# Patient Record
Sex: Female | Born: 1970
Health system: Southern US, Community
[De-identification: ages and names within clinical notes are randomized; demographics above are authoritative.]

---

## 2002-03-23 ENCOUNTER — Encounter: Payer: Self-pay | Admitting: Emergency Medicine

## 2002-03-23 ENCOUNTER — Emergency Department (HOSPITAL_COMMUNITY): Admission: EM | Admit: 2002-03-23 | Discharge: 2002-03-24 | Payer: Self-pay | Admitting: Emergency Medicine

## 2002-03-29 ENCOUNTER — Emergency Department (HOSPITAL_COMMUNITY): Admission: EM | Admit: 2002-03-29 | Discharge: 2002-03-29 | Payer: Self-pay | Admitting: Emergency Medicine

## 2002-07-08 ENCOUNTER — Encounter: Payer: Self-pay | Admitting: Internal Medicine

## 2002-07-08 ENCOUNTER — Encounter: Admission: RE | Admit: 2002-07-08 | Discharge: 2002-07-08 | Payer: Self-pay | Admitting: Internal Medicine

## 2003-09-07 ENCOUNTER — Encounter (HOSPITAL_COMMUNITY): Admission: RE | Admit: 2003-09-07 | Discharge: 2003-12-06 | Payer: Self-pay | Admitting: Internal Medicine

## 2003-11-07 ENCOUNTER — Emergency Department (HOSPITAL_COMMUNITY): Admission: EM | Admit: 2003-11-07 | Discharge: 2003-11-07 | Payer: Self-pay | Admitting: Emergency Medicine

## 2006-01-24 ENCOUNTER — Other Ambulatory Visit: Admission: RE | Admit: 2006-01-24 | Discharge: 2006-01-24 | Payer: Self-pay | Admitting: Gynecology

## 2007-02-26 ENCOUNTER — Inpatient Hospital Stay (HOSPITAL_COMMUNITY): Admission: AD | Admit: 2007-02-26 | Discharge: 2007-02-26 | Payer: Self-pay | Admitting: Gynecology

## 2007-05-22 ENCOUNTER — Ambulatory Visit (HOSPITAL_COMMUNITY): Admission: RE | Admit: 2007-05-22 | Discharge: 2007-05-22 | Payer: Self-pay | Admitting: Obstetrics

## 2007-08-12 ENCOUNTER — Ambulatory Visit (HOSPITAL_COMMUNITY): Admission: RE | Admit: 2007-08-12 | Discharge: 2007-08-12 | Payer: Self-pay | Admitting: Obstetrics

## 2007-10-05 ENCOUNTER — Inpatient Hospital Stay (HOSPITAL_COMMUNITY): Admission: AD | Admit: 2007-10-05 | Discharge: 2007-10-06 | Payer: Self-pay | Admitting: Obstetrics

## 2007-10-17 ENCOUNTER — Inpatient Hospital Stay (HOSPITAL_COMMUNITY): Admission: AD | Admit: 2007-10-17 | Discharge: 2007-10-23 | Payer: Self-pay | Admitting: Obstetrics

## 2007-10-20 ENCOUNTER — Encounter: Payer: Self-pay | Admitting: Obstetrics

## 2011-03-20 NOTE — Op Note (Signed)
Danielle Santiago, Danielle Santiago           ACCOUNT NO.:  [id number]   MEDICAL RECORD NO.:  [id number]          PATIENT TYPE:  INP   LOCATION:  9137                          FACILITY:  [facility]   PHYSICIAN:  Charles A. Clearance Coots, M.D.DATE OF BIRTH:  1971/06/22   DATE OF PROCEDURE:  10/20/2007  DATE OF DISCHARGE:                               OPERATIVE REPORT   PREOPERATIVE DIAGNOSIS:  39 weeks' gestation, gestational diabetes  mellitus, large for gestational age infant, failed induction of labor.   DISCHARGE DIAGNOSIS:  39 weeks' gestation, gestational diabetes  mellitus, large for gestational age infant, failed induction of labor.   PROCEDURE:  Primary low transverse cesarean section.   ANESTHESIA:  Spinal.   ESTIMATED BLOOD LOSS:  700 mL.   IV FLUIDS:  2700 mL.   URINE OUTPUT:  150 mL clear.   COMPLICATIONS:  None.   Foley to gravity.   FINDINGS:  Viable female at 98.  Apgars of 8 at one minute and 9 at  five minutes, weight of 9 pounds 11 ounces, normal uterus, ovaries and  fallopian tubes.   SPECIMEN:  Placenta.   DISPOSITION OF SPECIMEN:  Pathology.   OPERATION:  The patient was brought to operating room after satisfactory  spinal anesthesia, the abdomen was prepped and draped in usual sterile  fashion.  Pfannenstiel skin incision was made with a scalpel.  That was  deepened down to the fascia with a scalpel.  Fascia was nicked in the  midline and the fascial incision was extended to the left and to the  right with curved Mayo scissors.  The superior and inferior fascial  edges were taken off the rectus muscles both blunt sharp dissection.  Rectus muscles bluntly and sharply divided in midline.  Peritoneum was  entered digitally and was digitally extended to the left and to right.  The bladder blade was positioned.  The vesicouterine fold peritoneum  above the reflection of the urinary bladder was grasped with forceps and  was incised and undermined with Metzenbaum scissors.   The incision was  extended to left and to right with Metzenbaum scissors.  Bladder flap  was bluntly developed and the bladder blade was repositioned in front of  the urinary bladder placing it well out of operative field.  The uterus  was then entered transversely in the lower uterine segment with the  scalpel.  Clear amniotic fluid was expelled.  The uterine incision was  extended to the left and to the right with the bandage scissors.  The  vertex was noted to be left occiput transverse.  The occiput was rotated  into the incision and the vertex was delivered with the aid of fundal  pressure from the assistant.  The infant's mouth and nose were suctioned  with a suction bulb and delivery was completed with the aid of fundal  pressure from the assistant.  The umbilical cord was doubly clamped and  cut and the infant was handed off to nursery staff.  Cord blood was  obtained and the placenta was manually removed from the uterus intact.  The endometrial surface was thoroughly debrided with a dry  lap sponge  and the edges of the uterine incision were grasped with ring forceps and  the uterus was closed after suture ligation of each corner of the  incision with interrupted suture of 0 Monocryl.  The uterus was then  closed with a continuous interlocking suture of 0 Monocryl.  Hemostasis  was excellent.  The bladder flap was then closed with continuous suture  of 3-0 Monocryl.  Pelvic cavity was then thoroughly irrigated with warm  saline solution and all clots were removed and the abdomen was then  closed as follows.  Peritoneum was closed with continuous suture of 2-0  Vicryl, the fascia was closed with continuous suture of 0 Vicryl.  Subcutaneous tissue was thoroughly irrigated with warm saline solution  and all areas of subcutaneous bleeding were coagulated with Bovie.  Skin  was then closed with a continuous subcuticular suture of 3-0 Monocryl.  Sterile bandage was applied to the  incision closure.  Surgical  technician indicated that all needle, sponge and instrument counts were  correct x2.  The patient tolerated the procedure well and was  transported to recovery room in satisfactory condition.      Charles A. Clearance Coots, M.D.  Electronically Signed     CAH/MEDQ  D:  10/20/2007  T:  10/20/2007  Job:  [id number]

## 2011-03-23 NOTE — Discharge Summary (Signed)
NAMEJERNEY, Danielle Santiago           ACCOUNT NO.:  [id number]   MEDICAL RECORD NO.:  [id number]          PATIENT TYPE:  INP   LOCATION:  9137                          FACILITY:  [facility]   PHYSICIAN:  Roseanna Rainbow, M.D.DATE OF BIRTH:  [birth year]   DATE OF ADMISSION:  10/17/2007  DATE OF DISCHARGE:  10/23/2007                               DISCHARGE SUMMARY   CHIEF COMPLAINT:  The patient is a 40 year old para 1 with an estimated  date of confinement of October 22, 2007, who presents for induction of  labor secondary to gestational diabetes.   HISTORY OF PRESENT ILLNESS:  Please see the above.   PAST SURGICAL HISTORY:  Diagnostic laparoscopy.   PAST MEDICAL HISTORY:  She denies.   MEDICATIONS:  Prenatal vitamins.   ALLERGIES:  No known drug allergies.   SOCIAL HISTORY:  She is married.  She denies any tobacco, ethanol or  drug use.   PHYSICAL EXAMINATION:  VITAL SIGNS:  Stable, afebrile.  LUNGS:  Clear to auscultation bilaterally.  HEART:  Regular rate and rhythm.  ABDOMEN:  Gravid, nontender.  PELVIC:  Cervix 1-2 cm dilated, long, vertex at -3 station.   ASSESSMENT AND PLAN:  1. Intrauterine pregnancy at 39+ weeks.  2. Gestational diabetes.  3. For induction of labor.   HOSPITAL COURSE:  The patient was admitted.  She was started on low-dose  Pitocin, however, there was no cervical change.  The Pitocin was  discontinued and the cervix was then ripened with Cervidil.  The  Cervidil was then removed.  An ultrasound was obtained for an estimated  fetal weight and the estimated fetal weight was 10 pounds 4 ounces.  She  was started on low-dose Pitocin,  however, there was no cervical change  and the decision was made to proceed with a cesarean delivery for failed  induction of labor.  Please see the dictated operative summary.  On  postoperative day #1, her hemoglobin was 8.7.  The remainder of her  hospital course was uneventful.  She was discharged to home on  postoperative day #3.   DISCHARGE DIAGNOSES:  1. Gestational diabetes.  2. Large for gestational age fetus, infant.  3. Failed induction of labor.   PROCEDURE:  Cesarean delivery.   CONDITION:  Good.   DIET:  Regular.   ACTIVITY:  Progressive activity, pelvic rest.   DISPOSITION:  The patient was to follow up in the office in 2 weeks.   MEDICATIONS:  Percocet and ibuprofen.      Roseanna Rainbow, M.D.  Electronically Signed     LAJ/MEDQ  D:  11/09/2007  T:  11/09/2007  Job:  [id number]

## 2011-08-10 LAB — CBC
HCT: 24.7 — ABNORMAL LOW
Hemoglobin: 8.7 — ABNORMAL LOW
MCHC: 35.3
MCV: 75.6 — ABNORMAL LOW
Platelets: 240
RBC: 3.27 — ABNORMAL LOW
RDW: 20.8 — ABNORMAL HIGH
WBC: 12.4 — ABNORMAL HIGH

## 2011-08-13 LAB — CBC
HCT: 31.4 — ABNORMAL LOW
Hemoglobin: 11.3 — ABNORMAL LOW
MCHC: 36
MCV: 74.8 — ABNORMAL LOW
Platelets: 276
RBC: 4.2
RDW: 19.5 — ABNORMAL HIGH
WBC: 13.4 — ABNORMAL HIGH

## 2011-08-13 LAB — RPR: RPR Ser Ql: NONREACTIVE

## 2014-03-03 ENCOUNTER — Other Ambulatory Visit: Payer: Self-pay

## 2014-03-03 DIAGNOSIS — Z1231 Encounter for screening mammogram for malignant neoplasm of breast: Secondary | ICD-10-CM

## 2014-03-23 ENCOUNTER — Ambulatory Visit
Admission: RE | Admit: 2014-03-23 | Discharge: 2014-03-23 | Disposition: A | Discharge status: Home / Self Care | Payer: BC Managed Care – PPO | Source: Ambulatory Visit

## 2014-03-23 ENCOUNTER — Encounter (INDEPENDENT_AMBULATORY_CARE_PROVIDER_SITE_OTHER): Payer: Self-pay

## 2014-03-23 DIAGNOSIS — Z1231 Encounter for screening mammogram for malignant neoplasm of breast: Secondary | ICD-10-CM

## 2015-03-15 ENCOUNTER — Other Ambulatory Visit: Payer: Self-pay

## 2015-03-15 DIAGNOSIS — Z1231 Encounter for screening mammogram for malignant neoplasm of breast: Secondary | ICD-10-CM

## 2015-03-25 ENCOUNTER — Ambulatory Visit
Admission: RE | Admit: 2015-03-25 | Discharge: 2015-03-25 | Disposition: A | Discharge status: Home / Self Care | Payer: BLUE CROSS/BLUE SHIELD | Source: Ambulatory Visit

## 2015-03-25 ENCOUNTER — Encounter (INDEPENDENT_AMBULATORY_CARE_PROVIDER_SITE_OTHER): Payer: Self-pay

## 2015-03-25 DIAGNOSIS — Z1231 Encounter for screening mammogram for malignant neoplasm of breast: Secondary | ICD-10-CM

## 2016-03-02 DIAGNOSIS — R509 Fever, unspecified: Secondary | ICD-10-CM | POA: Diagnosis not present

## 2016-03-03 DIAGNOSIS — K529 Noninfective gastroenteritis and colitis, unspecified: Secondary | ICD-10-CM | POA: Diagnosis not present

## 2016-03-03 DIAGNOSIS — R918 Other nonspecific abnormal finding of lung field: Secondary | ICD-10-CM | POA: Diagnosis not present

## 2016-04-17 ENCOUNTER — Other Ambulatory Visit: Payer: Self-pay

## 2016-04-17 ENCOUNTER — Other Ambulatory Visit: Payer: Self-pay | Admitting: Internal Medicine

## 2016-04-17 DIAGNOSIS — Z1231 Encounter for screening mammogram for malignant neoplasm of breast: Secondary | ICD-10-CM

## 2016-04-30 ENCOUNTER — Ambulatory Visit
Admission: RE | Admit: 2016-04-30 | Discharge: 2016-04-30 | Disposition: A | Discharge status: Home / Self Care | Payer: BLUE CROSS/BLUE SHIELD | Source: Ambulatory Visit | Attending: Internal Medicine | Admitting: Internal Medicine

## 2016-04-30 DIAGNOSIS — Z1231 Encounter for screening mammogram for malignant neoplasm of breast: Secondary | ICD-10-CM

## 2017-03-05 DIAGNOSIS — M10079 Idiopathic gout, unspecified ankle and foot: Secondary | ICD-10-CM | POA: Diagnosis not present

## 2017-03-05 DIAGNOSIS — M25571 Pain in right ankle and joints of right foot: Secondary | ICD-10-CM | POA: Diagnosis not present

## 2017-03-05 DIAGNOSIS — M792 Neuralgia and neuritis, unspecified: Secondary | ICD-10-CM | POA: Diagnosis not present

## 2017-03-06 DIAGNOSIS — M25579 Pain in unspecified ankle and joints of unspecified foot: Secondary | ICD-10-CM | POA: Diagnosis not present

## 2017-03-06 DIAGNOSIS — Z131 Encounter for screening for diabetes mellitus: Secondary | ICD-10-CM | POA: Diagnosis not present

## 2017-03-06 DIAGNOSIS — Z13220 Encounter for screening for lipoid disorders: Secondary | ICD-10-CM | POA: Diagnosis not present

## 2017-03-06 DIAGNOSIS — E0590 Thyrotoxicosis, unspecified without thyrotoxic crisis or storm: Secondary | ICD-10-CM | POA: Diagnosis not present

## 2017-03-06 DIAGNOSIS — J302 Other seasonal allergic rhinitis: Secondary | ICD-10-CM | POA: Diagnosis not present

## 2017-05-15 ENCOUNTER — Other Ambulatory Visit: Payer: Self-pay | Admitting: Internal Medicine

## 2017-05-15 DIAGNOSIS — Z1231 Encounter for screening mammogram for malignant neoplasm of breast: Secondary | ICD-10-CM

## 2017-06-05 ENCOUNTER — Encounter: Payer: Self-pay | Admitting: Obstetrics

## 2017-06-05 ENCOUNTER — Ambulatory Visit (INDEPENDENT_AMBULATORY_CARE_PROVIDER_SITE_OTHER): Payer: BLUE CROSS/BLUE SHIELD | Admitting: Obstetrics

## 2017-06-05 ENCOUNTER — Ambulatory Visit
Admission: RE | Admit: 2017-06-05 | Discharge: 2017-06-05 | Disposition: A | Discharge status: Home / Self Care | Payer: BLUE CROSS/BLUE SHIELD | Source: Ambulatory Visit | Attending: Internal Medicine | Admitting: Internal Medicine

## 2017-06-05 VITALS — BP 125/85 | HR 86 | Wt 224.6 lb

## 2017-06-05 DIAGNOSIS — Z124 Encounter for screening for malignant neoplasm of cervix: Secondary | ICD-10-CM

## 2017-06-05 DIAGNOSIS — Z1231 Encounter for screening mammogram for malignant neoplasm of breast: Secondary | ICD-10-CM | POA: Diagnosis not present

## 2017-06-05 DIAGNOSIS — Z01419 Encounter for gynecological examination (general) (routine) without abnormal findings: Secondary | ICD-10-CM | POA: Diagnosis not present

## 2017-06-05 DIAGNOSIS — Z3009 Encounter for other general counseling and advice on contraception: Secondary | ICD-10-CM

## 2017-06-05 NOTE — Progress Notes (Signed)
Subjective:        Danielle Santiago is a 46 y.o. female here for a routine exam.  Current complaints: None.    Personal health questionnaire:  Is patient Ashkenazi Jewish, have a family history of breast and/or ovarian cancer: no Is there a family history of uterine cancer diagnosed at age < 1050, gastrointestinal cancer, urinary tract cancer, family member who is a Personnel officerLynch syndrome-associated carrier: no Is the patient overweight and hypertensive, family history of diabetes, personal history of gestational diabetes, preeclampsia or PCOS: no Is patient over 7055, have PCOS,  family history of premature CHD under age 44, diabetes, smoke, have hypertension or peripheral artery disease:  no At any time, has a partner hit, kicked or otherwise hurt or frightened you?: no Over the past 2 weeks, have you felt down, depressed or hopeless?: no Over the past 2 weeks, have you felt little interest or pleasure in doing things?:no   Gynecologic History Patient's last menstrual period was 05/22/2017 (exact date). Contraception: none Last Pap: unknown. Results were: normal Last mammogram: 2018. Results were: normal  Obstetric History OB History  Gravida Para Term Preterm AB Living  3         1  SAB TAB Ectopic Multiple Live Births               # Outcome Date GA Lbr Len/2nd Weight Sex Delivery Anes PTL Lv  3 Gravida           2 Gravida           1 Gravida               History reviewed. No pertinent past medical history.  History reviewed. No pertinent surgical history.   Current Outpatient Prescriptions:  .  cetirizine (ZYRTEC) 10 MG tablet, Take 10 mg by mouth daily., Disp: , Rfl:  .  montelukast (SINGULAIR) 10 MG tablet, Take 10 mg by mouth at bedtime., Disp: , Rfl:  No Known Allergies  Social History  Substance Use Topics  . Smoking status: Never Smoker  . Smokeless tobacco: Never Used  . Alcohol use No    Family History  Problem Relation Age of Onset  . Diabetes Father        Review of Systems  Constitutional: negative for fatigue and weight loss Respiratory: negative for cough and wheezing Cardiovascular: negative for chest pain, fatigue and palpitations Gastrointestinal: negative for abdominal pain and change in bowel habits Musculoskeletal:negative for myalgias Neurological: negative for gait problems and tremors Behavioral/Psych: negative for abusive relationship, depression Endocrine: negative for temperature intolerance    Genitourinary:negative for abnormal menstrual periods, genital lesions, hot flashes, sexual problems and vaginal discharge Integument/breast: negative for breast lump, breast tenderness, nipple discharge and skin lesion(s)    Objective:       BP 125/85   Pulse 86   Wt 224 lb 9.6 oz (101.9 kg)   LMP 05/22/2017 (Exact Date)  General:   alert  Skin:   no rash or abnormalities  Lungs:   clear to auscultation bilaterally  Heart:   regular rate and rhythm, S1, S2 normal, no murmur, click, rub or gallop  Breasts:   normal without suspicious masses, skin or nipple changes or axillary nodes  Abdomen:  normal findings: no organomegaly, soft, non-tender and no hernia  Pelvis:  External genitalia: normal general appearance Urinary system: urethral meatus normal and bladder without fullness, nontender Vaginal: normal without tenderness, induration or masses Cervix: normal appearance Adnexa: normal bimanual exam  Uterus: anteverted and non-tender, normal size   Lab Review Urine pregnancy test Labs reviewed yes Radiologic studies reviewed yes  50% of 20 min visit spent on counseling and coordination of care.    Assessment:     1. Encounter for routine gynecological examination with Papanicolaou smear of cervix Rx: - Cytology - PAP - Cervicovaginal ancillary only  2. Encounter for other general counseling and advice on contraception - declines contraception   Plan:    Education reviewed: calcium supplements, depression  evaluation, low fat, low cholesterol diet, self breast exams and weight bearing exercise. Contraception: none. Follow up in: 1 year.   Meds ordered this encounter  Medications  . cetirizine (ZYRTEC) 10 MG tablet    Sig: Take 10 mg by mouth daily.  . montelukast (SINGULAIR) 10 MG tablet    Sig: Take 10 mg by mouth at bedtime.   No orders of the defined types were placed in this encounter.    Patient ID: Danielle Santiago, female   DOB: 08/25/1971, 46 y.o.   MRN: [id number]

## 2017-06-05 NOTE — Progress Notes (Signed)
Patient is in the office for annual exam- patient reports she is doing well today.

## 2017-06-07 LAB — CERVICOVAGINAL ANCILLARY ONLY
Bacterial vaginitis: NEGATIVE
Candida vaginitis: NEGATIVE

## 2017-06-07 LAB — CYTOLOGY - PAP
Diagnosis: NEGATIVE
HPV: NOT DETECTED

## 2018-03-21 DIAGNOSIS — Z13220 Encounter for screening for lipoid disorders: Secondary | ICD-10-CM | POA: Diagnosis not present

## 2018-03-21 DIAGNOSIS — Z0000 Encounter for general adult medical examination without abnormal findings: Secondary | ICD-10-CM | POA: Diagnosis not present

## 2018-03-21 DIAGNOSIS — Z131 Encounter for screening for diabetes mellitus: Secondary | ICD-10-CM | POA: Diagnosis not present

## 2018-10-16 ENCOUNTER — Other Ambulatory Visit: Payer: Self-pay | Admitting: Internal Medicine

## 2018-10-16 DIAGNOSIS — Z1231 Encounter for screening mammogram for malignant neoplasm of breast: Secondary | ICD-10-CM

## 2018-10-27 ENCOUNTER — Other Ambulatory Visit: Payer: Self-pay

## 2018-10-27 ENCOUNTER — Encounter (HOSPITAL_COMMUNITY): Payer: Self-pay | Admitting: *Deleted

## 2018-10-27 ENCOUNTER — Emergency Department (HOSPITAL_COMMUNITY)
Admission: EM | Admit: 2018-10-27 | Discharge: 2018-10-27 | Disposition: A | Discharge status: Home / Self Care | Payer: BLUE CROSS/BLUE SHIELD | Attending: Emergency Medicine | Admitting: Emergency Medicine

## 2018-10-27 ENCOUNTER — Emergency Department (HOSPITAL_COMMUNITY): Payer: BLUE CROSS/BLUE SHIELD

## 2018-10-27 DIAGNOSIS — M549 Dorsalgia, unspecified: Secondary | ICD-10-CM | POA: Diagnosis not present

## 2018-10-27 DIAGNOSIS — R079 Chest pain, unspecified: Secondary | ICD-10-CM | POA: Diagnosis not present

## 2018-10-27 DIAGNOSIS — M62830 Muscle spasm of back: Secondary | ICD-10-CM | POA: Insufficient documentation

## 2018-10-27 DIAGNOSIS — R000 Tachycardia, unspecified: Secondary | ICD-10-CM | POA: Diagnosis not present

## 2018-10-27 DIAGNOSIS — R0789 Other chest pain: Secondary | ICD-10-CM | POA: Diagnosis not present

## 2018-10-27 LAB — I-STAT BETA HCG BLOOD, ED (MC, WL, AP ONLY): I-stat hCG, quantitative: <5 m[IU]/mL (ref <5)

## 2018-10-27 LAB — BASIC METABOLIC PANEL
Anion gap: 12 (ref 5–15)
BUN: 10 mg/dL (ref 6–20)
CO2: 21 mmol/L — AB (ref 22–32)
Calcium: 9.4 mg/dL (ref 8.9–10.3)
Chloride: 105 mmol/L (ref 98–111)
Creatinine, Ser: 0.82 mg/dL (ref 0.44–1.00)
GFR calc Af Amer: >60 mL/min (ref >60)
GFR calc non Af Amer: >60 mL/min (ref >60)
GLUCOSE: 98 mg/dL (ref 70–99)
Potassium: 4.2 mmol/L (ref 3.5–5.1)
Sodium: 138 mmol/L (ref 135–145)

## 2018-10-27 LAB — CBC
HCT: 31.7 % — ABNORMAL LOW (ref 36.0–46.0)
Hemoglobin: 10.9 g/dL — ABNORMAL LOW (ref 12.0–15.0)
MCH: 22.1 pg — ABNORMAL LOW (ref 26.0–34.0)
MCHC: 34.4 g/dL (ref 30.0–36.0)
MCV: 64.3 fL — ABNORMAL LOW (ref 80.0–100.0)
Platelets: 253 10*3/uL (ref 150–400)
RBC: 4.93 MIL/uL (ref 3.87–5.11)
RDW: 19 % — ABNORMAL HIGH (ref 11.5–15.5)
WBC: 10.1 10*3/uL (ref 4.0–10.5)
nRBC: 0 % (ref 0.0–0.2)

## 2018-10-27 LAB — TROPONIN I
Troponin I: <0.03 ng/mL (ref <0.03)
Troponin I: <0.03 ng/mL (ref <0.03)

## 2018-10-27 LAB — LIPASE, BLOOD: Lipase: 28 U/L (ref 11–51)

## 2018-10-27 LAB — D-DIMER, QUANTITATIVE: D-Dimer, Quant: 0.3 ug/mL-FEU (ref 0.00–0.50)

## 2018-10-27 MED ORDER — DIAZEPAM 5 MG PO TABS: 5.0000 mg | ORAL_TABLET | Freq: Two times a day (BID) | ORAL | 0 refills | Status: DC

## 2018-10-27 MED ORDER — KETOROLAC TROMETHAMINE 10 MG PO TABS: 10.0000 mg | ORAL_TABLET | Freq: Four times a day (QID) | ORAL | 0 refills | Status: DC | PRN

## 2018-10-27 MED ORDER — KETOROLAC TROMETHAMINE 30 MG/ML IJ SOLN
30.0000 mg | Freq: Once | INTRAMUSCULAR | Status: AC
  Administered 2018-10-27: 30 mg via INTRAVENOUS
  Filled 2018-10-27: qty 1

## 2018-10-27 MED ORDER — HYDROCODONE-ACETAMINOPHEN 5-325 MG PO TABS: 1.0000 | ORAL_TABLET | ORAL | 0 refills | Status: DC | PRN

## 2018-10-27 NOTE — ED Notes (Signed)
Pt stable and ambulatory for discharge, states understanding follow up.  

## 2018-10-27 NOTE — ED Provider Notes (Signed)
[facility] EMERGENCY DEPARTMENT Provider Note   CSN: [id number] Arrival date & time: 10/27/18  1636     History   Chief Complaint Chief Complaint  Patient presents with  . Chest Pain    HPI Danielle Santiago is a 47 y.o. female.  Pt presents to the ED today with right sided upper back pain that radiates to her chest.  She said it started about 1 week ago.  She said it hurts to move.  She has been taking otc tylenol/ibuprofen and using a massaging machine, but it continues to hurt.  She denies any abd pain.  No n/v/d.  No sob.  No leg swelling.  No long trips.     History reviewed. No pertinent past medical history.  There are no active problems to display for this patient.   History reviewed. No pertinent surgical history.   OB History    Gravida  3   Para      Term      Preterm      AB      Living  1     SAB      TAB      Ectopic      Multiple      Live Births               Home Medications    Prior to Admission medications   Medication Sig Start Date End Date Taking? Authorizing Provider  cetirizine (ZYRTEC) 10 MG tablet Take 10 mg by mouth daily.   Yes [provider]  montelukast (SINGULAIR) 10 MG tablet Take 10 mg by mouth at bedtime.   Yes [provider]  diazepam (VALIUM) 5 MG tablet Take 1 tablet (5 mg total) by mouth 2 (two) times daily. 10/27/18   Jacalyn Lefevre, MD  HYDROcodone-acetaminophen (NORCO/VICODIN) 5-325 MG tablet Take 1 tablet by mouth every 4 (four) hours as needed. 10/27/18   Jacalyn Lefevre, MD  ketorolac (TORADOL) 10 MG tablet Take 1 tablet (10 mg total) by mouth every 6 (six) hours as needed. 10/27/18   Jacalyn Lefevre, MD    Family History Family History  Problem Relation Age of Onset  . Diabetes Father     Social History Social History   Tobacco Use  . Smoking status: Never Smoker  . Smokeless tobacco: Never Used  Substance Use Topics  . Alcohol use: No  . Drug  use: No     Allergies   Patient has no known allergies.   Review of Systems Review of Systems  Cardiovascular: Positive for chest pain.  Musculoskeletal: Positive for back pain.  All other systems reviewed and are negative.    Physical Exam Updated Vital Signs BP 140/72   Pulse 78   Temp 98.9 F (37.2 C) (Oral)   Resp 20   Ht 5\' 2"  (1.575 m)   Wt 89.8 kg   LMP 10/09/2018   SpO2 99%   BMI 36.21 kg/m   Physical Exam Vitals signs and nursing note reviewed.  Constitutional:      Appearance: She is well-developed. She is obese.  HENT:     Head: Normocephalic and atraumatic.  Eyes:     Extraocular Movements: Extraocular movements intact.     Pupils: Pupils are equal, round, and reactive to light.  Neck:     Musculoskeletal: Normal range of motion and neck supple.  Cardiovascular:     Rate and Rhythm: Normal rate and regular rhythm.  Heart sounds: Normal heart sounds.  Pulmonary:     Effort: Pulmonary effort is normal.     Breath sounds: Normal breath sounds.    Abdominal:     General: Bowel sounds are normal.     Palpations: Abdomen is soft.  Musculoskeletal: Normal range of motion.  Skin:    General: Skin is warm and dry.     Capillary Refill: Capillary refill takes less than 2 seconds.  Neurological:     General: No focal deficit present.     Mental Status: She is alert and oriented to person, place, and time.  Psychiatric:        Mood and Affect: Mood normal.        Behavior: Behavior normal.      ED Treatments / Results  Labs (all labs ordered are listed, but only abnormal results are displayed) Labs Reviewed  BASIC METABOLIC PANEL - Abnormal; Notable for the following components:      Result Value   CO2 21 (*)    All other components within normal limits  CBC - Abnormal; Notable for the following components:   Hemoglobin 10.9 (*)    HCT 31.7 (*)    MCV 64.3 (*)    MCH 22.1 (*)    RDW 19.0 (*)    All other components within normal  limits  TROPONIN I  D-DIMER, QUANTITATIVE (NOT [facility])  LIPASE, BLOOD  TROPONIN I  I-STAT BETA HCG BLOOD, ED ([facility], [facility], [facility] ONLY)    EKG EKG Interpretation  Date/Time:  Monday October 27 2018 17:27:38 EST Ventricular Rate:  104 PR Interval:  158 QRS Duration: 82 QT Interval:  346 QTC Calculation: 454 R Axis:   39 Text Interpretation:  Sinus tachycardia Otherwise normal ECG No old tracing to compare Confirmed by Jacalyn LefevreHaviland, Tionne Dayhoff [id number]) on 10/27/2018 8:54:47 PM   Radiology Dg Chest 2 View  Result Date: 10/27/2018 CLINICAL DATA:  Right-sided back pain radiating to the front since this morning. No reported acute injury. EXAM: CHEST - 2 VIEW COMPARISON:  None. FINDINGS: The heart size is at the upper limits of normal. The mediastinal contours are normal. The lungs are clear. There is no pleural effusion or pneumothorax. Mild degenerative changes are present within the spine. No acute osseous findings are seen. IMPRESSION: Borderline heart size.  No acute cardiopulmonary process. Electronically Signed   By: Carey BullocksWilliam  Veazey M.D.   On: 10/27/2018 18:01    Procedures Procedures (including critical care time)  Medications Ordered in ED Medications  ketorolac (TORADOL) 30 MG/ML injection 30 mg (30 mg Intravenous Given 10/27/18 2119)     Initial Impression / Assessment and Plan / ED Course  I have reviewed the triage vital signs and the nursing notes.  Pertinent labs & imaging results that were available during my care of the patient were reviewed by me and considered in my medical decision making (see chart for details).    Pt is feeling much better.  DDimer is negative and labs nl.  Pt is stable for d/c.  Return if worse.  Final Clinical Impressions(s) / ED Diagnoses   Final diagnoses:  Spasm of thoracic back muscle    ED Discharge Orders         Ordered    HYDROcodone-acetaminophen (NORCO/VICODIN) 5-325 MG tablet  Every 4 hours PRN     10/27/18 2258    ketorolac (TORADOL)  10 MG tablet  Every 6 hours PRN     10/27/18 2258    diazepam (  VALIUM) 5 MG tablet  2 times daily     10/27/18 2258           Jacalyn LefevreHaviland, Nimsi Males, MD 10/27/18 [phone]

## 2018-10-27 NOTE — ED Triage Notes (Signed)
The pt is c/o chest anterior and posterior with rt shoulder radiation with movement for over one week no n no vomiting no diarrhea   lmp 5th

## 2018-11-26 ENCOUNTER — Ambulatory Visit
Admission: RE | Admit: 2018-11-26 | Discharge: 2018-11-26 | Disposition: A | Discharge status: Home / Self Care | Payer: BLUE CROSS/BLUE SHIELD | Source: Ambulatory Visit | Attending: Internal Medicine | Admitting: Internal Medicine

## 2018-11-26 DIAGNOSIS — Z1231 Encounter for screening mammogram for malignant neoplasm of breast: Secondary | ICD-10-CM

## 2019-04-01 ENCOUNTER — Ambulatory Visit (HOSPITAL_COMMUNITY)
Admission: EM | Admit: 2019-04-01 | Discharge: 2019-04-01 | Disposition: A | Discharge status: Home / Self Care | Payer: BLUE CROSS/BLUE SHIELD | Attending: Internal Medicine | Admitting: Internal Medicine

## 2019-04-01 ENCOUNTER — Encounter (HOSPITAL_COMMUNITY): Payer: Self-pay

## 2019-04-01 ENCOUNTER — Other Ambulatory Visit: Payer: Self-pay

## 2019-04-01 DIAGNOSIS — S46911A Strain of unspecified muscle, fascia and tendon at shoulder and upper arm level, right arm, initial encounter: Secondary | ICD-10-CM

## 2019-04-01 DIAGNOSIS — X500XXA Overexertion from strenuous movement or load, initial encounter: Secondary | ICD-10-CM | POA: Diagnosis not present

## 2019-04-01 DIAGNOSIS — M25511 Pain in right shoulder: Secondary | ICD-10-CM | POA: Diagnosis not present

## 2019-04-01 MED ORDER — MELOXICAM 7.5 MG PO TABS: 7.5000 mg | ORAL_TABLET | Freq: Two times a day (BID) | ORAL | 0 refills | Status: AC | PRN

## 2019-04-01 MED ORDER — CYCLOBENZAPRINE HCL 10 MG PO TABS: 10.0000 mg | ORAL_TABLET | Freq: Every evening | ORAL | 0 refills | Status: AC | PRN

## 2019-04-01 MED ORDER — KETOROLAC TROMETHAMINE 60 MG/2ML IM SOLN
INTRAMUSCULAR | Status: AC
  Filled 2019-04-01: qty 2

## 2019-04-01 MED ORDER — KETOROLAC TROMETHAMINE 60 MG/2ML IM SOLN
60.0000 mg | Freq: Once | INTRAMUSCULAR | Status: AC
  Administered 2019-04-01: 19:00:00 60 mg via INTRAMUSCULAR

## 2019-04-01 NOTE — ED Triage Notes (Signed)
Patient presents to Urgent Care with complaints of right shoulder pain since pulling it 4 days ago. 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

## 2019-04-01 NOTE — Discharge Instructions (Addendum)
You may take muscle relaxer before bedtime for tension/pain relief to help you sleep through the night. You may take meloxicam up to twice daily as needed for additional pain and swelling relief. Apply ice 3 times daily for additional swelling relief. Follow-up with occupational health (call their office tomorrow) for further evaluation.

## 2019-04-01 NOTE — ED Provider Notes (Signed)
[facility]    CSN: [id number] Arrival date & time: 04/01/19  1756     History   Chief Complaint Chief Complaint  Patient presents with  . Shoulder Pain    Right    HPI Danielle Santiago is a 48 y.o. female presenting for acute concern of right shoulder pain.  Patient states that this started Friday 5/22 after assisting coworkers with lifting the patient.  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  Patient states that she will occasionally help out when staffing is short.  Patient states that she felt a pop and some pain thereafter.  Patient applied ice to the area over the weekend which helped.  Patient is also taken Tylenol, Aleve with minimal relief.  Patient went to work on Monday 5/25 and her shoulder again when assisting with the patient left.  Patient denies pop/snap/tear sensation this time.  Patient states the pain radiates from her upper trapezius, through her shoulder, down to her elbow.  Patient denies paresthesias of her hands bilaterally, weakness, numbness.  Patient denies dropping objects, inability to move her arm.  Patient denies injury to this area since Monday, no other areas of concern.    History reviewed. No pertinent past medical history.  There are no active problems to display for this patient.   History reviewed. No pertinent surgical history.  OB History    Gravida  3   Para      Term      Preterm      AB      Living  1     SAB      TAB      Ectopic      Multiple      Live Births               Home Medications    Prior to Admission medications   Medication Sig Start Date End Date Taking? Authorizing Provider  cetirizine (ZYRTEC) 10 MG tablet Take 10 mg by mouth daily.   Yes [provider]  montelukast (SINGULAIR) 10 MG tablet Take 10 mg by mouth at bedtime.   Yes [provider]  cyclobenzaprine (FLEXERIL) 10 MG tablet Take 1 tablet (10 mg total) by mouth at  bedtime as needed for up to 5 days for muscle spasms. 04/01/19 04/06/19  Hall-Potvin, [place], PA-C  meloxicam (MOBIC) 7.5 MG tablet Take 1 tablet (7.5 mg total) by mouth 2 (two) times daily as needed for up to 5 days for pain. 04/01/19 04/06/19  Hall-Potvin, [place], PA-C    Family History Family History  Problem Relation Age of Onset  . Diabetes Father     Social History Social History   Tobacco Use  . Smoking status: Never Smoker  . Smokeless tobacco: Never Used  Substance Use Topics  . Alcohol use: No  . Drug use: No     Allergies   Patient has no known allergies.   Review of Systems Review of Systems as per HPI   Physical Exam Triage Vital Signs ED Triage Vitals  Enc Vitals Group     BP 04/01/19 1821 (!) 150/89     Pulse Rate 04/01/19 1821 96     Resp 04/01/19 1821 18     Temp 04/01/19 1821 98.8 F (37.1 C)     Temp Source 04/01/19 1821 Oral     SpO2 04/01/19 1821 100 %     Weight --      Height --  Head Circumference --      Peak Flow --      Pain Score 04/01/19 1819 8     Pain Loc --      Pain Edu? --      Excl. in GC? --    No data found.  Updated Vital Signs BP (!) 150/89 (BP Location: Left Arm)   Pulse 96   Temp 98.8 F (37.1 C) (Oral)   Resp 18   LMP 03/10/2019 (Exact Date)   SpO2 100%   Visual Acuity Right Eye Distance:   Left Eye Distance:   Bilateral Distance:    Right Eye Near:   Left Eye Near:    Bilateral Near:     Physical Exam Constitutional:      General: She is not in acute distress.    Appearance: She is obese.  Cardiovascular:     Rate and Rhythm: Normal rate.  Pulmonary:     Effort: Pulmonary effort is normal.  Musculoskeletal:     Right shoulder: She exhibits decreased range of motion, tenderness, swelling, pain, spasm and decreased strength. She exhibits no bony tenderness, no effusion, no crepitus, no deformity, no laceration and normal pulse.     Comments: Slightly decreased range of motion and strength second  to pain.  Mild hypertonicity of superior aspect of right trapezius; no spasm elicited/noted.  No underlying erythema, ecchymosis, warmth.  Negative for impingement.  Grip strength slightly decreased on right second to pain.  Brachioradialis DTRs 2+ bilaterally/symmetric.  NVI bilaterally  Neurological:     Mental Status: She is alert.      UC Treatments / Results  Labs (all labs ordered are listed, but only abnormal results are displayed) Labs Reviewed - No data to display  EKG None  Radiology No results found.  Procedures Procedures (including critical care time)  Medications Ordered in UC Medications  ketorolac (TORADOL) injection 60 mg (60 mg Intramuscular Given 04/01/19 1858)    Initial Impression / Assessment and Plan / UC Course  I have reviewed the triage vital signs and the nursing notes.  Pertinent labs & imaging results that were available during my care of the patient were reviewed by me and considered in my medical decision making (see chart for details).     48 year old female presenting for right shoulder pain.  Likely muscle strain due to mechanism of injury and physical exam.  Will proceed with NSAIDs, icing as is provided pain relief.  Toradol given in office.  Patient to take muscle relaxer before bedtime to help her sleep through the night and provide pain/tension relief. Final Clinical Impressions(s) / UC Diagnoses   Final diagnoses:  Strain of right shoulder, initial encounter     Discharge Instructions     You may take muscle relaxer before bedtime for tension/pain relief to help you sleep through the night. You may take meloxicam up to twice daily as needed for additional pain and swelling relief. Apply ice 3 times daily for additional swelling relief. Follow-up with occupational health (call their office tomorrow) for further evaluation.    ED Prescriptions    Medication Sig Dispense Auth. Provider   cyclobenzaprine (FLEXERIL) 10 MG tablet  Take 1 tablet (10 mg total) by mouth at bedtime as needed for up to 5 days for muscle spasms. 5 tablet Hall-Potvin, [place], PA-C   meloxicam (MOBIC) 7.5 MG tablet Take 1 tablet (7.5 mg total) by mouth 2 (two) times daily as needed for up to 5 days for pain.  10 tablet Hall-Potvin, [place], PA-C     Controlled Substance Prescriptions [place] Controlled Substance Registry consulted? Not Applicable   Shea Evans, [place] 04/01/19 1942

## 2019-08-07 DIAGNOSIS — R030 Elevated blood-pressure reading, without diagnosis of hypertension: Secondary | ICD-10-CM | POA: Diagnosis not present

## 2019-08-07 DIAGNOSIS — Z131 Encounter for screening for diabetes mellitus: Secondary | ICD-10-CM | POA: Diagnosis not present

## 2019-08-07 DIAGNOSIS — Z13220 Encounter for screening for lipoid disorders: Secondary | ICD-10-CM | POA: Diagnosis not present

## 2019-08-07 DIAGNOSIS — E559 Vitamin D deficiency, unspecified: Secondary | ICD-10-CM | POA: Diagnosis not present

## 2019-08-07 DIAGNOSIS — J302 Other seasonal allergic rhinitis: Secondary | ICD-10-CM | POA: Diagnosis not present

## 2019-08-07 DIAGNOSIS — M25519 Pain in unspecified shoulder: Secondary | ICD-10-CM | POA: Diagnosis not present

## 2019-08-07 DIAGNOSIS — Z0000 Encounter for general adult medical examination without abnormal findings: Secondary | ICD-10-CM | POA: Diagnosis not present

## 2019-10-14 DIAGNOSIS — Z1159 Encounter for screening for other viral diseases: Secondary | ICD-10-CM | POA: Diagnosis not present

## 2019-10-14 DIAGNOSIS — Z20828 Contact with and (suspected) exposure to other viral communicable diseases: Secondary | ICD-10-CM | POA: Diagnosis not present

## 2019-11-09 DIAGNOSIS — J302 Other seasonal allergic rhinitis: Secondary | ICD-10-CM | POA: Diagnosis not present

## 2019-11-09 DIAGNOSIS — R030 Elevated blood-pressure reading, without diagnosis of hypertension: Secondary | ICD-10-CM | POA: Diagnosis not present

## 2019-11-09 DIAGNOSIS — M25511 Pain in right shoulder: Secondary | ICD-10-CM | POA: Diagnosis not present

## 2019-11-09 DIAGNOSIS — Z6841 Body Mass Index (BMI) 40.0 and over, adult: Secondary | ICD-10-CM | POA: Diagnosis not present

## 2019-11-21 DIAGNOSIS — Z1159 Encounter for screening for other viral diseases: Secondary | ICD-10-CM | POA: Diagnosis not present

## 2019-11-21 DIAGNOSIS — Z20828 Contact with and (suspected) exposure to other viral communicable diseases: Secondary | ICD-10-CM | POA: Diagnosis not present

## 2019-11-26 DIAGNOSIS — Z20828 Contact with and (suspected) exposure to other viral communicable diseases: Secondary | ICD-10-CM | POA: Diagnosis not present

## 2019-11-26 DIAGNOSIS — Z1159 Encounter for screening for other viral diseases: Secondary | ICD-10-CM | POA: Diagnosis not present

## 2019-11-30 DIAGNOSIS — Z1159 Encounter for screening for other viral diseases: Secondary | ICD-10-CM | POA: Diagnosis not present

## 2019-11-30 DIAGNOSIS — Z20828 Contact with and (suspected) exposure to other viral communicable diseases: Secondary | ICD-10-CM | POA: Diagnosis not present

## 2019-12-03 DIAGNOSIS — Z1159 Encounter for screening for other viral diseases: Secondary | ICD-10-CM | POA: Diagnosis not present

## 2019-12-03 DIAGNOSIS — Z20828 Contact with and (suspected) exposure to other viral communicable diseases: Secondary | ICD-10-CM | POA: Diagnosis not present

## 2019-12-07 DIAGNOSIS — Z1159 Encounter for screening for other viral diseases: Secondary | ICD-10-CM | POA: Diagnosis not present

## 2019-12-07 DIAGNOSIS — Z20828 Contact with and (suspected) exposure to other viral communicable diseases: Secondary | ICD-10-CM | POA: Diagnosis not present

## 2019-12-10 DIAGNOSIS — Z1159 Encounter for screening for other viral diseases: Secondary | ICD-10-CM | POA: Diagnosis not present

## 2019-12-10 DIAGNOSIS — Z20828 Contact with and (suspected) exposure to other viral communicable diseases: Secondary | ICD-10-CM | POA: Diagnosis not present

## 2019-12-14 DIAGNOSIS — Z1159 Encounter for screening for other viral diseases: Secondary | ICD-10-CM | POA: Diagnosis not present

## 2019-12-14 DIAGNOSIS — Z20828 Contact with and (suspected) exposure to other viral communicable diseases: Secondary | ICD-10-CM | POA: Diagnosis not present

## 2019-12-17 DIAGNOSIS — Z1159 Encounter for screening for other viral diseases: Secondary | ICD-10-CM | POA: Diagnosis not present

## 2019-12-17 DIAGNOSIS — Z20828 Contact with and (suspected) exposure to other viral communicable diseases: Secondary | ICD-10-CM | POA: Diagnosis not present

## 2019-12-21 DIAGNOSIS — Z20828 Contact with and (suspected) exposure to other viral communicable diseases: Secondary | ICD-10-CM | POA: Diagnosis not present

## 2019-12-21 DIAGNOSIS — Z1159 Encounter for screening for other viral diseases: Secondary | ICD-10-CM | POA: Diagnosis not present

## 2019-12-31 DIAGNOSIS — Z1159 Encounter for screening for other viral diseases: Secondary | ICD-10-CM | POA: Diagnosis not present

## 2019-12-31 DIAGNOSIS — Z20828 Contact with and (suspected) exposure to other viral communicable diseases: Secondary | ICD-10-CM | POA: Diagnosis not present

## 2020-01-07 DIAGNOSIS — Z1159 Encounter for screening for other viral diseases: Secondary | ICD-10-CM | POA: Diagnosis not present

## 2020-01-07 DIAGNOSIS — Z20828 Contact with and (suspected) exposure to other viral communicable diseases: Secondary | ICD-10-CM | POA: Diagnosis not present

## 2020-01-14 DIAGNOSIS — Z20828 Contact with and (suspected) exposure to other viral communicable diseases: Secondary | ICD-10-CM | POA: Diagnosis not present

## 2020-01-14 DIAGNOSIS — Z1159 Encounter for screening for other viral diseases: Secondary | ICD-10-CM | POA: Diagnosis not present

## 2020-01-18 DIAGNOSIS — Z1159 Encounter for screening for other viral diseases: Secondary | ICD-10-CM | POA: Diagnosis not present

## 2020-01-18 DIAGNOSIS — Z20828 Contact with and (suspected) exposure to other viral communicable diseases: Secondary | ICD-10-CM | POA: Diagnosis not present

## 2020-01-25 DIAGNOSIS — Z20828 Contact with and (suspected) exposure to other viral communicable diseases: Secondary | ICD-10-CM | POA: Diagnosis not present

## 2020-01-25 DIAGNOSIS — Z1159 Encounter for screening for other viral diseases: Secondary | ICD-10-CM | POA: Diagnosis not present

## 2020-02-08 DIAGNOSIS — Z1159 Encounter for screening for other viral diseases: Secondary | ICD-10-CM | POA: Diagnosis not present

## 2020-02-08 DIAGNOSIS — Z20828 Contact with and (suspected) exposure to other viral communicable diseases: Secondary | ICD-10-CM | POA: Diagnosis not present

## 2020-02-15 DIAGNOSIS — Z1159 Encounter for screening for other viral diseases: Secondary | ICD-10-CM | POA: Diagnosis not present

## 2020-02-15 DIAGNOSIS — Z20828 Contact with and (suspected) exposure to other viral communicable diseases: Secondary | ICD-10-CM | POA: Diagnosis not present

## 2020-02-22 DIAGNOSIS — Z20828 Contact with and (suspected) exposure to other viral communicable diseases: Secondary | ICD-10-CM | POA: Diagnosis not present

## 2020-02-22 DIAGNOSIS — Z1159 Encounter for screening for other viral diseases: Secondary | ICD-10-CM | POA: Diagnosis not present

## 2020-02-29 DIAGNOSIS — Z20828 Contact with and (suspected) exposure to other viral communicable diseases: Secondary | ICD-10-CM | POA: Diagnosis not present

## 2020-02-29 DIAGNOSIS — Z1159 Encounter for screening for other viral diseases: Secondary | ICD-10-CM | POA: Diagnosis not present

## 2020-03-07 DIAGNOSIS — Z20828 Contact with and (suspected) exposure to other viral communicable diseases: Secondary | ICD-10-CM | POA: Diagnosis not present

## 2020-03-07 DIAGNOSIS — Z1159 Encounter for screening for other viral diseases: Secondary | ICD-10-CM | POA: Diagnosis not present

## 2020-03-14 DIAGNOSIS — Z20828 Contact with and (suspected) exposure to other viral communicable diseases: Secondary | ICD-10-CM | POA: Diagnosis not present

## 2020-03-14 DIAGNOSIS — Z1159 Encounter for screening for other viral diseases: Secondary | ICD-10-CM | POA: Diagnosis not present

## 2020-03-21 DIAGNOSIS — Z1159 Encounter for screening for other viral diseases: Secondary | ICD-10-CM | POA: Diagnosis not present

## 2020-03-21 DIAGNOSIS — Z20828 Contact with and (suspected) exposure to other viral communicable diseases: Secondary | ICD-10-CM | POA: Diagnosis not present

## 2020-03-28 DIAGNOSIS — Z1159 Encounter for screening for other viral diseases: Secondary | ICD-10-CM | POA: Diagnosis not present

## 2020-03-28 DIAGNOSIS — Z20828 Contact with and (suspected) exposure to other viral communicable diseases: Secondary | ICD-10-CM | POA: Diagnosis not present

## 2020-04-04 DIAGNOSIS — Z20828 Contact with and (suspected) exposure to other viral communicable diseases: Secondary | ICD-10-CM | POA: Diagnosis not present

## 2020-04-04 DIAGNOSIS — Z1159 Encounter for screening for other viral diseases: Secondary | ICD-10-CM | POA: Diagnosis not present

## 2020-04-11 DIAGNOSIS — Z1159 Encounter for screening for other viral diseases: Secondary | ICD-10-CM | POA: Diagnosis not present

## 2020-04-11 DIAGNOSIS — Z20828 Contact with and (suspected) exposure to other viral communicable diseases: Secondary | ICD-10-CM | POA: Diagnosis not present

## 2020-04-28 DIAGNOSIS — Z20828 Contact with and (suspected) exposure to other viral communicable diseases: Secondary | ICD-10-CM | POA: Diagnosis not present

## 2020-04-28 DIAGNOSIS — Z1159 Encounter for screening for other viral diseases: Secondary | ICD-10-CM | POA: Diagnosis not present

## 2020-05-02 DIAGNOSIS — Z20828 Contact with and (suspected) exposure to other viral communicable diseases: Secondary | ICD-10-CM | POA: Diagnosis not present

## 2020-05-02 DIAGNOSIS — Z1159 Encounter for screening for other viral diseases: Secondary | ICD-10-CM | POA: Diagnosis not present

## 2020-05-09 DIAGNOSIS — Z20822 Contact with and (suspected) exposure to covid-19: Secondary | ICD-10-CM | POA: Diagnosis not present

## 2020-06-23 DIAGNOSIS — Z1159 Encounter for screening for other viral diseases: Secondary | ICD-10-CM | POA: Diagnosis not present

## 2020-06-23 DIAGNOSIS — Z20828 Contact with and (suspected) exposure to other viral communicable diseases: Secondary | ICD-10-CM | POA: Diagnosis not present

## 2020-06-28 DIAGNOSIS — Z20828 Contact with and (suspected) exposure to other viral communicable diseases: Secondary | ICD-10-CM | POA: Diagnosis not present

## 2020-06-28 DIAGNOSIS — Z1159 Encounter for screening for other viral diseases: Secondary | ICD-10-CM | POA: Diagnosis not present

## 2020-06-30 DIAGNOSIS — Z1159 Encounter for screening for other viral diseases: Secondary | ICD-10-CM | POA: Diagnosis not present

## 2020-06-30 DIAGNOSIS — Z20828 Contact with and (suspected) exposure to other viral communicable diseases: Secondary | ICD-10-CM | POA: Diagnosis not present

## 2020-07-04 DIAGNOSIS — Z1159 Encounter for screening for other viral diseases: Secondary | ICD-10-CM | POA: Diagnosis not present

## 2020-07-04 DIAGNOSIS — Z20828 Contact with and (suspected) exposure to other viral communicable diseases: Secondary | ICD-10-CM | POA: Diagnosis not present

## 2020-07-14 DIAGNOSIS — Z1159 Encounter for screening for other viral diseases: Secondary | ICD-10-CM | POA: Diagnosis not present

## 2020-07-14 DIAGNOSIS — Z20828 Contact with and (suspected) exposure to other viral communicable diseases: Secondary | ICD-10-CM | POA: Diagnosis not present

## 2020-07-18 DIAGNOSIS — Z1159 Encounter for screening for other viral diseases: Secondary | ICD-10-CM | POA: Diagnosis not present

## 2020-07-18 DIAGNOSIS — Z20828 Contact with and (suspected) exposure to other viral communicable diseases: Secondary | ICD-10-CM | POA: Diagnosis not present

## 2020-07-21 DIAGNOSIS — Z20828 Contact with and (suspected) exposure to other viral communicable diseases: Secondary | ICD-10-CM | POA: Diagnosis not present

## 2020-07-21 DIAGNOSIS — Z1159 Encounter for screening for other viral diseases: Secondary | ICD-10-CM | POA: Diagnosis not present

## 2020-07-25 DIAGNOSIS — Z20828 Contact with and (suspected) exposure to other viral communicable diseases: Secondary | ICD-10-CM | POA: Diagnosis not present

## 2020-07-25 DIAGNOSIS — Z1159 Encounter for screening for other viral diseases: Secondary | ICD-10-CM | POA: Diagnosis not present

## 2020-07-28 DIAGNOSIS — Z1159 Encounter for screening for other viral diseases: Secondary | ICD-10-CM | POA: Diagnosis not present

## 2020-07-28 DIAGNOSIS — Z20828 Contact with and (suspected) exposure to other viral communicable diseases: Secondary | ICD-10-CM | POA: Diagnosis not present

## 2020-08-01 DIAGNOSIS — Z20828 Contact with and (suspected) exposure to other viral communicable diseases: Secondary | ICD-10-CM | POA: Diagnosis not present

## 2020-08-01 DIAGNOSIS — Z1159 Encounter for screening for other viral diseases: Secondary | ICD-10-CM | POA: Diagnosis not present

## 2020-08-04 DIAGNOSIS — Z20828 Contact with and (suspected) exposure to other viral communicable diseases: Secondary | ICD-10-CM | POA: Diagnosis not present

## 2020-08-04 DIAGNOSIS — Z1159 Encounter for screening for other viral diseases: Secondary | ICD-10-CM | POA: Diagnosis not present

## 2020-08-08 DIAGNOSIS — Z20828 Contact with and (suspected) exposure to other viral communicable diseases: Secondary | ICD-10-CM | POA: Diagnosis not present

## 2020-08-08 DIAGNOSIS — Z1159 Encounter for screening for other viral diseases: Secondary | ICD-10-CM | POA: Diagnosis not present

## 2020-08-11 DIAGNOSIS — Z1159 Encounter for screening for other viral diseases: Secondary | ICD-10-CM | POA: Diagnosis not present

## 2020-08-11 DIAGNOSIS — Z20828 Contact with and (suspected) exposure to other viral communicable diseases: Secondary | ICD-10-CM | POA: Diagnosis not present

## 2020-08-15 DIAGNOSIS — Z1159 Encounter for screening for other viral diseases: Secondary | ICD-10-CM | POA: Diagnosis not present

## 2020-08-15 DIAGNOSIS — Z20828 Contact with and (suspected) exposure to other viral communicable diseases: Secondary | ICD-10-CM | POA: Diagnosis not present

## 2020-08-18 DIAGNOSIS — Z1159 Encounter for screening for other viral diseases: Secondary | ICD-10-CM | POA: Diagnosis not present

## 2020-08-18 DIAGNOSIS — Z20828 Contact with and (suspected) exposure to other viral communicable diseases: Secondary | ICD-10-CM | POA: Diagnosis not present

## 2020-08-22 DIAGNOSIS — Z20828 Contact with and (suspected) exposure to other viral communicable diseases: Secondary | ICD-10-CM | POA: Diagnosis not present

## 2020-08-22 DIAGNOSIS — Z1159 Encounter for screening for other viral diseases: Secondary | ICD-10-CM | POA: Diagnosis not present

## 2020-08-29 DIAGNOSIS — Z20828 Contact with and (suspected) exposure to other viral communicable diseases: Secondary | ICD-10-CM | POA: Diagnosis not present

## 2020-08-29 DIAGNOSIS — Z1159 Encounter for screening for other viral diseases: Secondary | ICD-10-CM | POA: Diagnosis not present

## 2020-09-15 DIAGNOSIS — Z1159 Encounter for screening for other viral diseases: Secondary | ICD-10-CM | POA: Diagnosis not present

## 2020-09-15 DIAGNOSIS — Z20828 Contact with and (suspected) exposure to other viral communicable diseases: Secondary | ICD-10-CM | POA: Diagnosis not present

## 2020-09-22 DIAGNOSIS — Z20828 Contact with and (suspected) exposure to other viral communicable diseases: Secondary | ICD-10-CM | POA: Diagnosis not present

## 2020-09-22 DIAGNOSIS — Z1159 Encounter for screening for other viral diseases: Secondary | ICD-10-CM | POA: Diagnosis not present

## 2020-09-26 DIAGNOSIS — Z1159 Encounter for screening for other viral diseases: Secondary | ICD-10-CM | POA: Diagnosis not present

## 2020-09-26 DIAGNOSIS — Z20828 Contact with and (suspected) exposure to other viral communicable diseases: Secondary | ICD-10-CM | POA: Diagnosis not present

## 2020-10-03 DIAGNOSIS — Z1159 Encounter for screening for other viral diseases: Secondary | ICD-10-CM | POA: Diagnosis not present

## 2020-10-03 DIAGNOSIS — Z20828 Contact with and (suspected) exposure to other viral communicable diseases: Secondary | ICD-10-CM | POA: Diagnosis not present

## 2020-10-06 DIAGNOSIS — Z20828 Contact with and (suspected) exposure to other viral communicable diseases: Secondary | ICD-10-CM | POA: Diagnosis not present

## 2020-10-06 DIAGNOSIS — Z1159 Encounter for screening for other viral diseases: Secondary | ICD-10-CM | POA: Diagnosis not present

## 2020-10-13 DIAGNOSIS — Z1159 Encounter for screening for other viral diseases: Secondary | ICD-10-CM | POA: Diagnosis not present

## 2020-10-13 DIAGNOSIS — Z20828 Contact with and (suspected) exposure to other viral communicable diseases: Secondary | ICD-10-CM | POA: Diagnosis not present

## 2020-10-17 DIAGNOSIS — Z1159 Encounter for screening for other viral diseases: Secondary | ICD-10-CM | POA: Diagnosis not present

## 2020-10-17 DIAGNOSIS — Z20828 Contact with and (suspected) exposure to other viral communicable diseases: Secondary | ICD-10-CM | POA: Diagnosis not present

## 2020-10-20 DIAGNOSIS — Z20828 Contact with and (suspected) exposure to other viral communicable diseases: Secondary | ICD-10-CM | POA: Diagnosis not present

## 2020-10-20 DIAGNOSIS — Z1159 Encounter for screening for other viral diseases: Secondary | ICD-10-CM | POA: Diagnosis not present

## 2020-10-27 DIAGNOSIS — Z1159 Encounter for screening for other viral diseases: Secondary | ICD-10-CM | POA: Diagnosis not present

## 2020-10-27 DIAGNOSIS — Z20828 Contact with and (suspected) exposure to other viral communicable diseases: Secondary | ICD-10-CM | POA: Diagnosis not present

## 2020-10-31 DIAGNOSIS — Z1159 Encounter for screening for other viral diseases: Secondary | ICD-10-CM | POA: Diagnosis not present

## 2020-10-31 DIAGNOSIS — Z20828 Contact with and (suspected) exposure to other viral communicable diseases: Secondary | ICD-10-CM | POA: Diagnosis not present

## 2020-11-03 DIAGNOSIS — Z20828 Contact with and (suspected) exposure to other viral communicable diseases: Secondary | ICD-10-CM | POA: Diagnosis not present

## 2020-11-03 DIAGNOSIS — Z1159 Encounter for screening for other viral diseases: Secondary | ICD-10-CM | POA: Diagnosis not present

## 2020-11-07 DIAGNOSIS — Z1159 Encounter for screening for other viral diseases: Secondary | ICD-10-CM | POA: Diagnosis not present

## 2020-11-07 DIAGNOSIS — Z20828 Contact with and (suspected) exposure to other viral communicable diseases: Secondary | ICD-10-CM | POA: Diagnosis not present

## 2020-11-10 DIAGNOSIS — Z1159 Encounter for screening for other viral diseases: Secondary | ICD-10-CM | POA: Diagnosis not present

## 2020-11-10 DIAGNOSIS — Z20828 Contact with and (suspected) exposure to other viral communicable diseases: Secondary | ICD-10-CM | POA: Diagnosis not present

## 2020-11-14 DIAGNOSIS — Z1159 Encounter for screening for other viral diseases: Secondary | ICD-10-CM | POA: Diagnosis not present

## 2020-11-14 DIAGNOSIS — Z20828 Contact with and (suspected) exposure to other viral communicable diseases: Secondary | ICD-10-CM | POA: Diagnosis not present

## 2020-11-17 DIAGNOSIS — Z20828 Contact with and (suspected) exposure to other viral communicable diseases: Secondary | ICD-10-CM | POA: Diagnosis not present

## 2020-11-17 DIAGNOSIS — Z1159 Encounter for screening for other viral diseases: Secondary | ICD-10-CM | POA: Diagnosis not present

## 2020-11-21 DIAGNOSIS — Z1159 Encounter for screening for other viral diseases: Secondary | ICD-10-CM | POA: Diagnosis not present

## 2020-11-21 DIAGNOSIS — Z20828 Contact with and (suspected) exposure to other viral communicable diseases: Secondary | ICD-10-CM | POA: Diagnosis not present

## 2020-11-28 ENCOUNTER — Other Ambulatory Visit: Payer: Self-pay | Admitting: Internal Medicine

## 2020-11-28 DIAGNOSIS — Z20828 Contact with and (suspected) exposure to other viral communicable diseases: Secondary | ICD-10-CM | POA: Diagnosis not present

## 2020-11-28 DIAGNOSIS — Z1231 Encounter for screening mammogram for malignant neoplasm of breast: Secondary | ICD-10-CM

## 2020-11-28 DIAGNOSIS — Z1159 Encounter for screening for other viral diseases: Secondary | ICD-10-CM | POA: Diagnosis not present

## 2020-12-05 DIAGNOSIS — Z20828 Contact with and (suspected) exposure to other viral communicable diseases: Secondary | ICD-10-CM | POA: Diagnosis not present

## 2020-12-05 DIAGNOSIS — Z1159 Encounter for screening for other viral diseases: Secondary | ICD-10-CM | POA: Diagnosis not present

## 2020-12-08 DIAGNOSIS — Z1159 Encounter for screening for other viral diseases: Secondary | ICD-10-CM | POA: Diagnosis not present

## 2020-12-08 DIAGNOSIS — Z20828 Contact with and (suspected) exposure to other viral communicable diseases: Secondary | ICD-10-CM | POA: Diagnosis not present

## 2020-12-15 DIAGNOSIS — Z20828 Contact with and (suspected) exposure to other viral communicable diseases: Secondary | ICD-10-CM | POA: Diagnosis not present

## 2020-12-15 DIAGNOSIS — Z1159 Encounter for screening for other viral diseases: Secondary | ICD-10-CM | POA: Diagnosis not present

## 2020-12-19 DIAGNOSIS — Z20828 Contact with and (suspected) exposure to other viral communicable diseases: Secondary | ICD-10-CM | POA: Diagnosis not present

## 2020-12-19 DIAGNOSIS — Z1159 Encounter for screening for other viral diseases: Secondary | ICD-10-CM | POA: Diagnosis not present

## 2020-12-22 DIAGNOSIS — Z1159 Encounter for screening for other viral diseases: Secondary | ICD-10-CM | POA: Diagnosis not present

## 2020-12-22 DIAGNOSIS — Z20828 Contact with and (suspected) exposure to other viral communicable diseases: Secondary | ICD-10-CM | POA: Diagnosis not present

## 2020-12-29 DIAGNOSIS — Z20828 Contact with and (suspected) exposure to other viral communicable diseases: Secondary | ICD-10-CM | POA: Diagnosis not present

## 2020-12-29 DIAGNOSIS — Z1159 Encounter for screening for other viral diseases: Secondary | ICD-10-CM | POA: Diagnosis not present

## 2021-01-02 DIAGNOSIS — Z1159 Encounter for screening for other viral diseases: Secondary | ICD-10-CM | POA: Diagnosis not present

## 2021-01-02 DIAGNOSIS — Z20828 Contact with and (suspected) exposure to other viral communicable diseases: Secondary | ICD-10-CM | POA: Diagnosis not present

## 2021-01-05 DIAGNOSIS — Z20828 Contact with and (suspected) exposure to other viral communicable diseases: Secondary | ICD-10-CM | POA: Diagnosis not present

## 2021-01-05 DIAGNOSIS — Z1159 Encounter for screening for other viral diseases: Secondary | ICD-10-CM | POA: Diagnosis not present

## 2021-01-09 DIAGNOSIS — Z1159 Encounter for screening for other viral diseases: Secondary | ICD-10-CM | POA: Diagnosis not present

## 2021-01-09 DIAGNOSIS — Z20828 Contact with and (suspected) exposure to other viral communicable diseases: Secondary | ICD-10-CM | POA: Diagnosis not present

## 2021-01-11 ENCOUNTER — Ambulatory Visit
Admission: RE | Admit: 2021-01-11 | Discharge: 2021-01-11 | Disposition: A | Discharge status: Home / Self Care | Payer: BC Managed Care – PPO | Source: Ambulatory Visit | Attending: Internal Medicine | Admitting: Internal Medicine

## 2021-01-11 ENCOUNTER — Other Ambulatory Visit: Payer: Self-pay

## 2021-01-11 DIAGNOSIS — Z1231 Encounter for screening mammogram for malignant neoplasm of breast: Secondary | ICD-10-CM

## 2021-01-12 DIAGNOSIS — Z1159 Encounter for screening for other viral diseases: Secondary | ICD-10-CM | POA: Diagnosis not present

## 2021-01-12 DIAGNOSIS — Z20828 Contact with and (suspected) exposure to other viral communicable diseases: Secondary | ICD-10-CM | POA: Diagnosis not present

## 2021-01-16 DIAGNOSIS — Z20828 Contact with and (suspected) exposure to other viral communicable diseases: Secondary | ICD-10-CM | POA: Diagnosis not present

## 2021-01-16 DIAGNOSIS — Z1159 Encounter for screening for other viral diseases: Secondary | ICD-10-CM | POA: Diagnosis not present

## 2021-01-19 DIAGNOSIS — Z1159 Encounter for screening for other viral diseases: Secondary | ICD-10-CM | POA: Diagnosis not present

## 2021-01-19 DIAGNOSIS — Z20828 Contact with and (suspected) exposure to other viral communicable diseases: Secondary | ICD-10-CM | POA: Diagnosis not present

## 2021-01-23 DIAGNOSIS — Z1159 Encounter for screening for other viral diseases: Secondary | ICD-10-CM | POA: Diagnosis not present

## 2021-01-23 DIAGNOSIS — Z20828 Contact with and (suspected) exposure to other viral communicable diseases: Secondary | ICD-10-CM | POA: Diagnosis not present

## 2021-01-30 DIAGNOSIS — Z20828 Contact with and (suspected) exposure to other viral communicable diseases: Secondary | ICD-10-CM | POA: Diagnosis not present

## 2021-01-30 DIAGNOSIS — Z1159 Encounter for screening for other viral diseases: Secondary | ICD-10-CM | POA: Diagnosis not present

## 2021-02-06 DIAGNOSIS — Z1159 Encounter for screening for other viral diseases: Secondary | ICD-10-CM | POA: Diagnosis not present

## 2021-02-06 DIAGNOSIS — Z20828 Contact with and (suspected) exposure to other viral communicable diseases: Secondary | ICD-10-CM | POA: Diagnosis not present

## 2021-02-13 DIAGNOSIS — Z1159 Encounter for screening for other viral diseases: Secondary | ICD-10-CM | POA: Diagnosis not present

## 2021-02-13 DIAGNOSIS — Z20828 Contact with and (suspected) exposure to other viral communicable diseases: Secondary | ICD-10-CM | POA: Diagnosis not present

## 2021-02-20 DIAGNOSIS — Z1159 Encounter for screening for other viral diseases: Secondary | ICD-10-CM | POA: Diagnosis not present

## 2021-02-20 DIAGNOSIS — Z20828 Contact with and (suspected) exposure to other viral communicable diseases: Secondary | ICD-10-CM | POA: Diagnosis not present

## 2021-02-27 DIAGNOSIS — Z20828 Contact with and (suspected) exposure to other viral communicable diseases: Secondary | ICD-10-CM | POA: Diagnosis not present

## 2021-02-27 DIAGNOSIS — Z1159 Encounter for screening for other viral diseases: Secondary | ICD-10-CM | POA: Diagnosis not present

## 2021-03-09 DIAGNOSIS — Z1159 Encounter for screening for other viral diseases: Secondary | ICD-10-CM | POA: Diagnosis not present

## 2021-03-09 DIAGNOSIS — Z20828 Contact with and (suspected) exposure to other viral communicable diseases: Secondary | ICD-10-CM | POA: Diagnosis not present

## 2021-03-13 DIAGNOSIS — Z1159 Encounter for screening for other viral diseases: Secondary | ICD-10-CM | POA: Diagnosis not present

## 2021-03-13 DIAGNOSIS — Z20828 Contact with and (suspected) exposure to other viral communicable diseases: Secondary | ICD-10-CM | POA: Diagnosis not present

## 2021-03-20 DIAGNOSIS — Z20828 Contact with and (suspected) exposure to other viral communicable diseases: Secondary | ICD-10-CM | POA: Diagnosis not present

## 2021-03-20 DIAGNOSIS — Z1159 Encounter for screening for other viral diseases: Secondary | ICD-10-CM | POA: Diagnosis not present

## 2021-04-03 DIAGNOSIS — Z20828 Contact with and (suspected) exposure to other viral communicable diseases: Secondary | ICD-10-CM | POA: Diagnosis not present

## 2021-04-03 DIAGNOSIS — Z1159 Encounter for screening for other viral diseases: Secondary | ICD-10-CM | POA: Diagnosis not present

## 2021-04-05 DIAGNOSIS — M7661 Achilles tendinitis, right leg: Secondary | ICD-10-CM | POA: Diagnosis not present

## 2021-04-05 DIAGNOSIS — M7731 Calcaneal spur, right foot: Secondary | ICD-10-CM | POA: Diagnosis not present

## 2021-04-05 DIAGNOSIS — M79671 Pain in right foot: Secondary | ICD-10-CM | POA: Diagnosis not present

## 2021-04-05 DIAGNOSIS — M722 Plantar fascial fibromatosis: Secondary | ICD-10-CM | POA: Diagnosis not present

## 2021-04-05 DIAGNOSIS — M71571 Other bursitis, not elsewhere classified, right ankle and foot: Secondary | ICD-10-CM | POA: Diagnosis not present

## 2021-04-10 DIAGNOSIS — Z1159 Encounter for screening for other viral diseases: Secondary | ICD-10-CM | POA: Diagnosis not present

## 2021-04-10 DIAGNOSIS — Z20828 Contact with and (suspected) exposure to other viral communicable diseases: Secondary | ICD-10-CM | POA: Diagnosis not present

## 2021-04-17 DIAGNOSIS — Z20828 Contact with and (suspected) exposure to other viral communicable diseases: Secondary | ICD-10-CM | POA: Diagnosis not present

## 2021-04-17 DIAGNOSIS — Z1159 Encounter for screening for other viral diseases: Secondary | ICD-10-CM | POA: Diagnosis not present

## 2021-04-19 DIAGNOSIS — M71571 Other bursitis, not elsewhere classified, right ankle and foot: Secondary | ICD-10-CM | POA: Diagnosis not present

## 2021-04-19 DIAGNOSIS — M7661 Achilles tendinitis, right leg: Secondary | ICD-10-CM | POA: Diagnosis not present

## 2021-04-24 DIAGNOSIS — Z20828 Contact with and (suspected) exposure to other viral communicable diseases: Secondary | ICD-10-CM | POA: Diagnosis not present

## 2021-04-24 DIAGNOSIS — Z1159 Encounter for screening for other viral diseases: Secondary | ICD-10-CM | POA: Diagnosis not present

## 2021-04-27 DIAGNOSIS — Z1159 Encounter for screening for other viral diseases: Secondary | ICD-10-CM | POA: Diagnosis not present

## 2021-04-27 DIAGNOSIS — Z20828 Contact with and (suspected) exposure to other viral communicable diseases: Secondary | ICD-10-CM | POA: Diagnosis not present

## 2021-09-26 IMAGING — MG MM DIGITAL SCREENING BILAT W/ TOMO AND CAD
8 of 14 series · 8 of 40 positions shown · non-contrast
Comparison: Previous exam(s).

CLINICAL DATA: Screening.

EXAM:
DIGITAL SCREENING BILATERAL MAMMOGRAM WITH TOMOSYNTHESIS AND CAD
TECHNIQUE: Bilateral screening digital craniocaudal and mediolateral oblique
mammograms were obtained. Bilateral screening digital breast
tomosynthesis was performed. The images were evaluated with
computer-aided detection.

[L MLO synth-2D (1 of 2)]
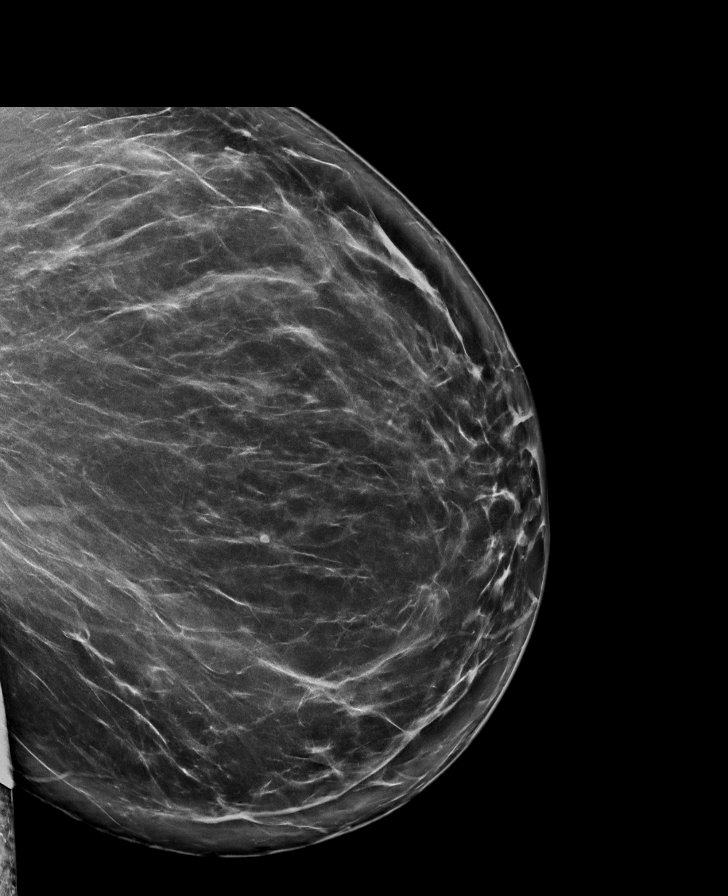

[R CC synth-2D]
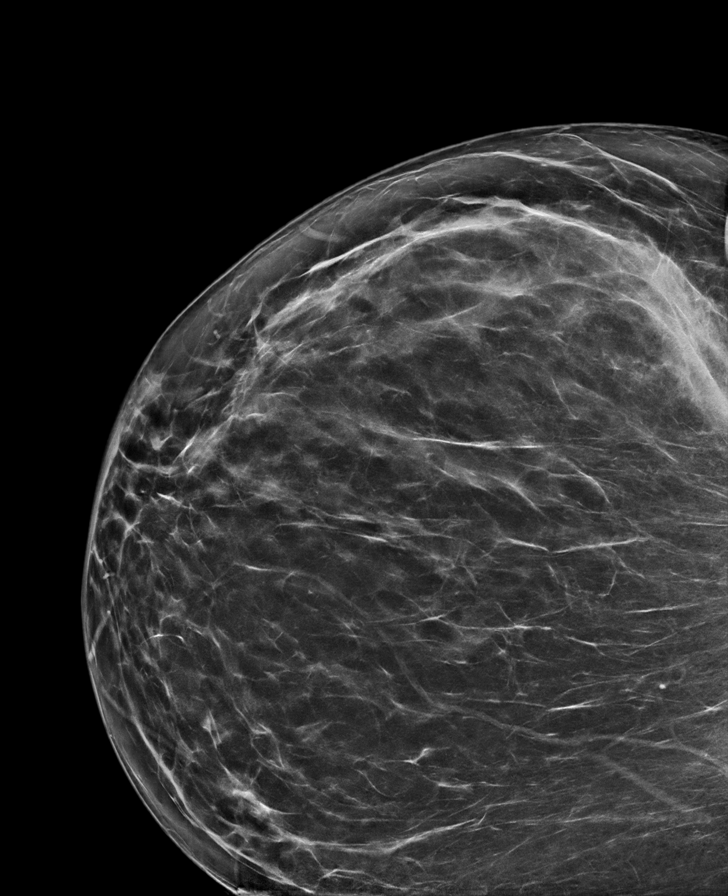

[L CC synth-2D]
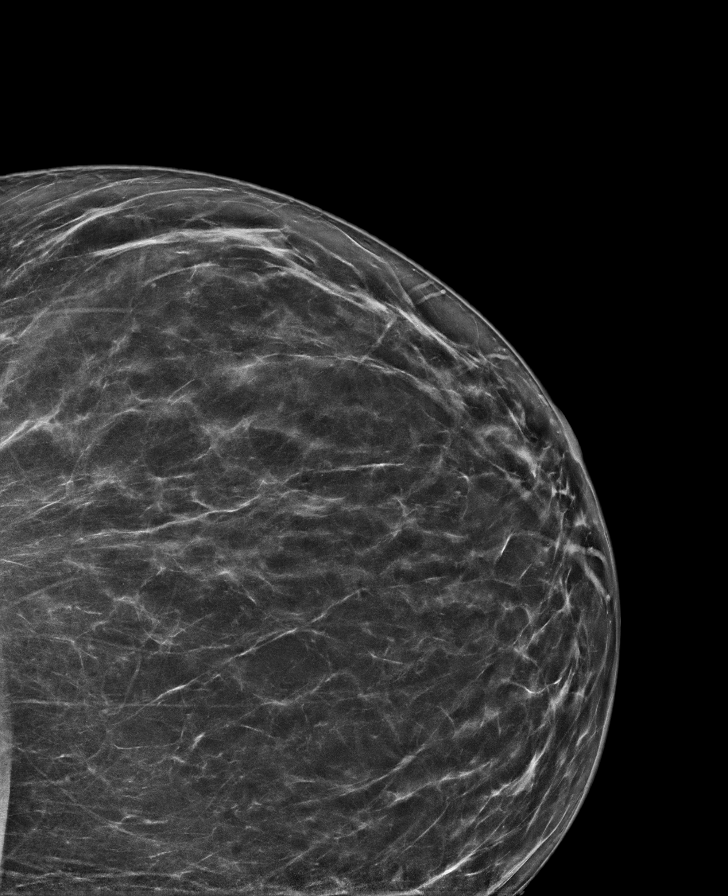

[R MLO synth-2D (1 of 2)]
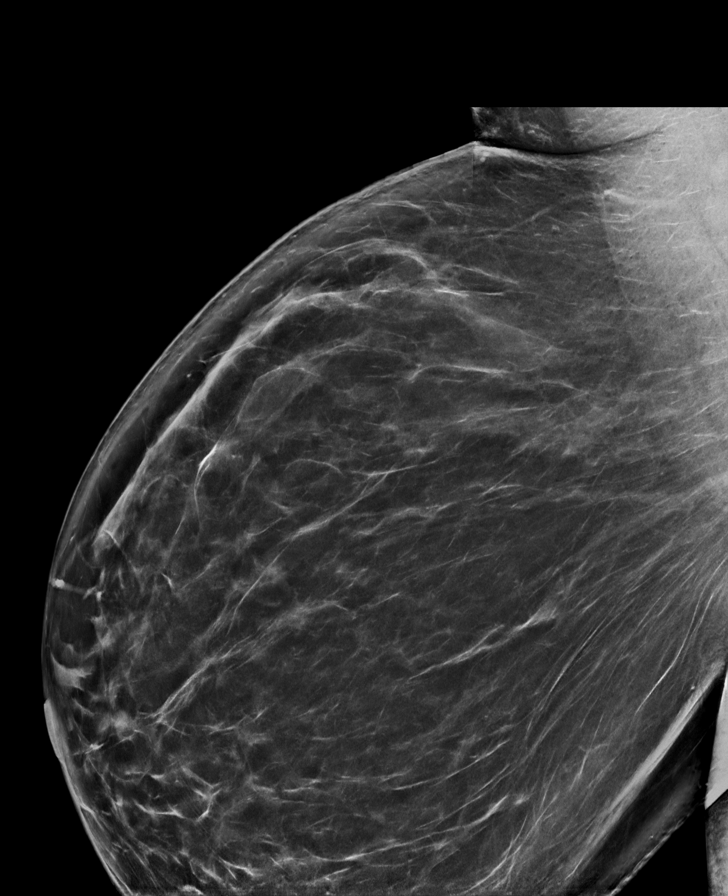

[R CV synth-2D]
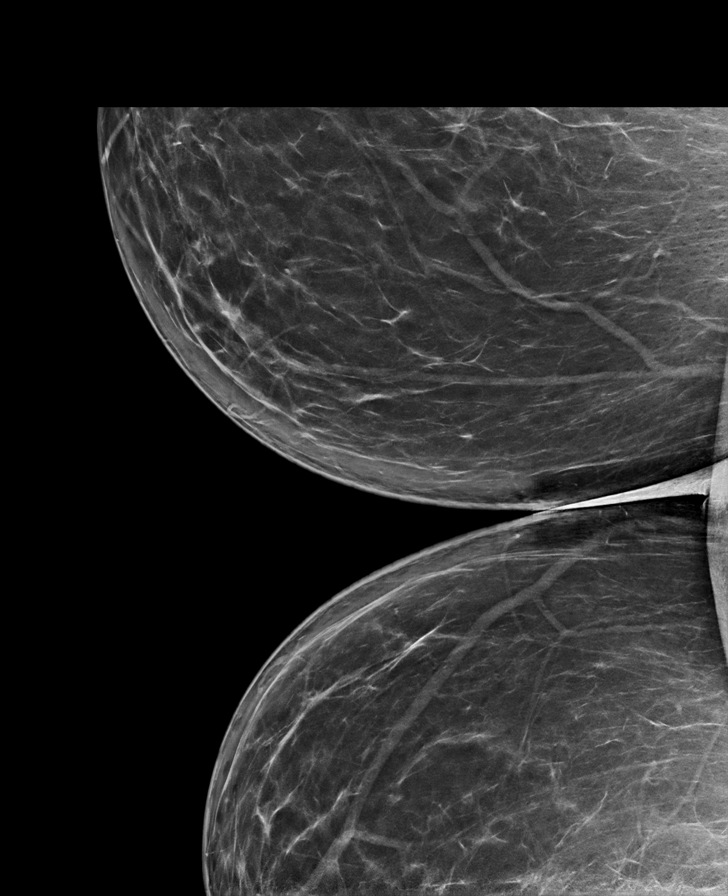

[R MLO synth-2D (2 of 2)]
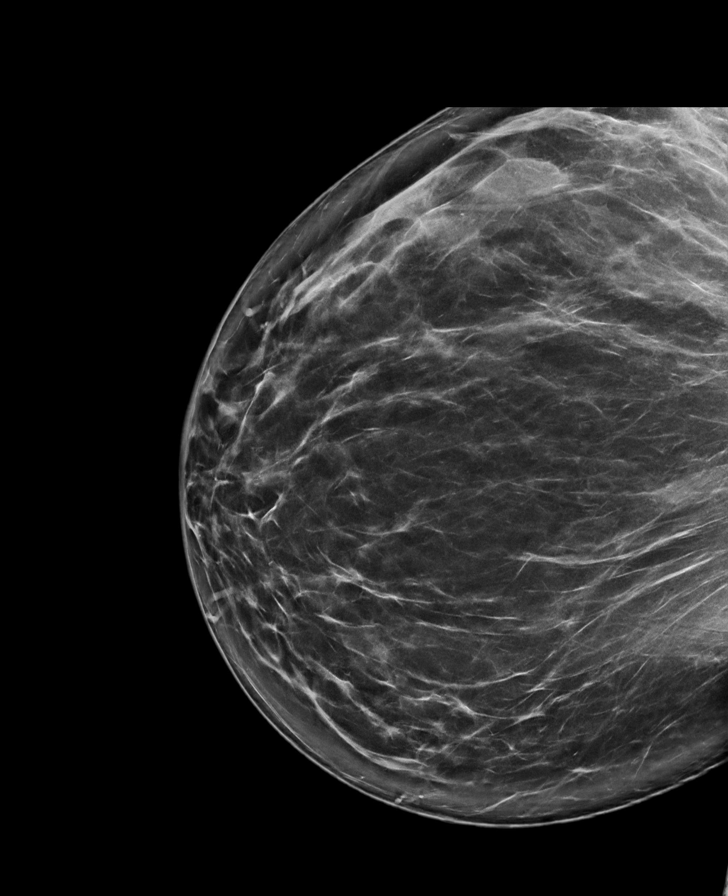

[L MLO synth-2D (2 of 2)]
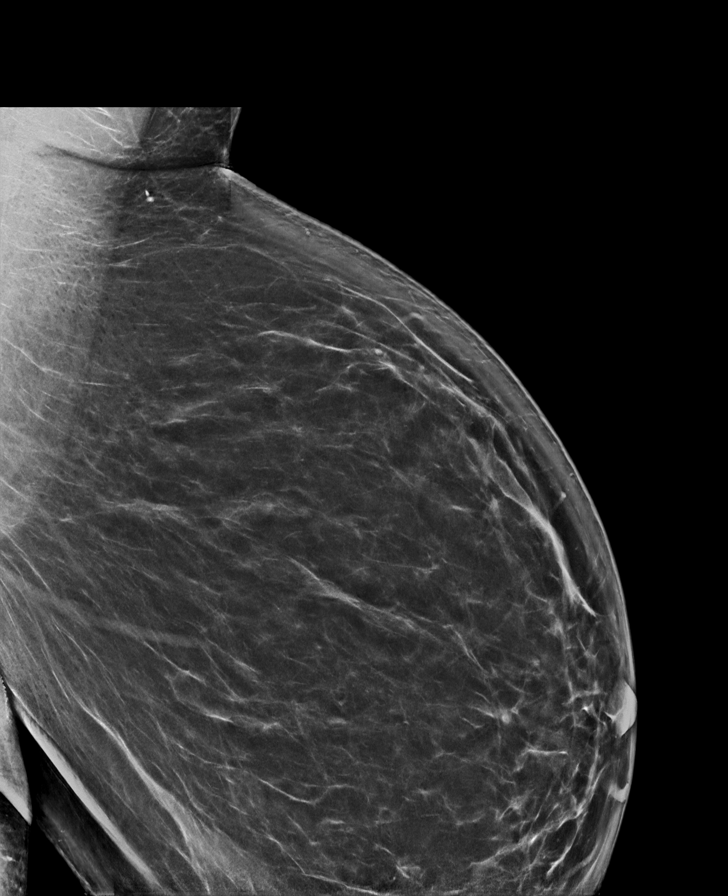

[R MLO tomo · tomo slice 51/101.0]
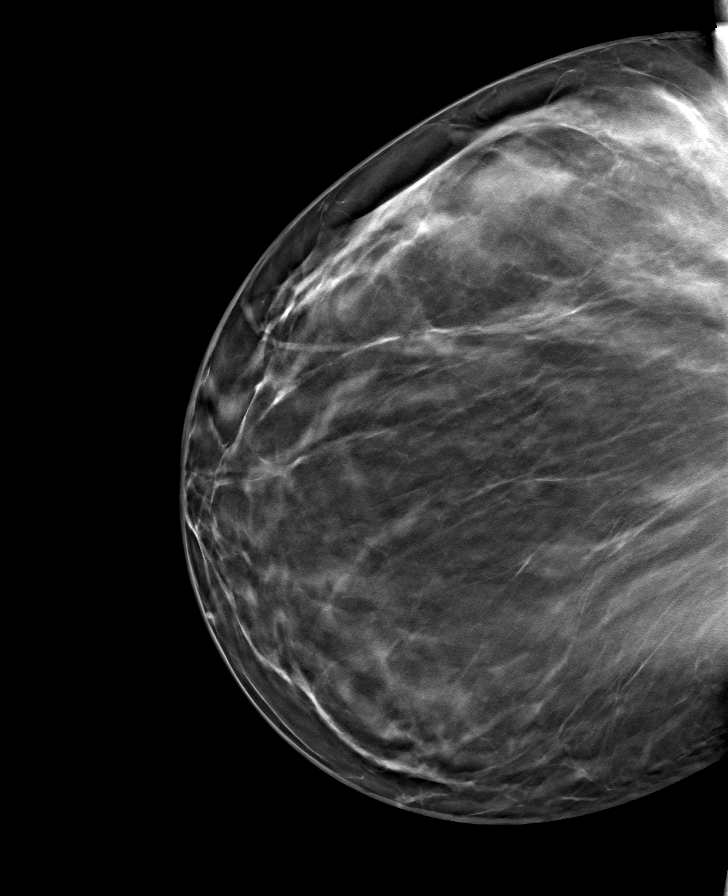

[8 of 40 positions shown; findings below may reference images not displayed]

ACR Breast Density Category b: There are scattered areas of
fibroglandular density.
FINDINGS: There are no findings suspicious for malignancy. The images were
evaluated with computer-aided detection.
IMPRESSION: No mammographic evidence of malignancy. A result letter of this
screening mammogram will be mailed directly to the patient.

RECOMMENDATION:
Screening mammogram in one year. (Code:WJ-I-BG6)

BI-RADS CATEGORY  1: Negative.

## 2022-02-08 ENCOUNTER — Other Ambulatory Visit: Payer: Self-pay | Admitting: Internal Medicine

## 2022-02-08 DIAGNOSIS — Z1231 Encounter for screening mammogram for malignant neoplasm of breast: Secondary | ICD-10-CM

## 2022-02-16 ENCOUNTER — Ambulatory Visit
Admission: RE | Admit: 2022-02-16 | Discharge: 2022-02-16 | Disposition: A | Discharge status: Home / Self Care | Payer: BC Managed Care – PPO | Source: Ambulatory Visit | Attending: Internal Medicine | Admitting: Internal Medicine

## 2022-02-16 DIAGNOSIS — Z1231 Encounter for screening mammogram for malignant neoplasm of breast: Secondary | ICD-10-CM | POA: Diagnosis not present

## 2022-03-07 DIAGNOSIS — M71571 Other bursitis, not elsewhere classified, right ankle and foot: Secondary | ICD-10-CM | POA: Diagnosis not present

## 2022-03-07 DIAGNOSIS — M7661 Achilles tendinitis, right leg: Secondary | ICD-10-CM | POA: Diagnosis not present

## 2022-03-21 DIAGNOSIS — M7661 Achilles tendinitis, right leg: Secondary | ICD-10-CM | POA: Diagnosis not present

## 2022-03-21 DIAGNOSIS — M71571 Other bursitis, not elsewhere classified, right ankle and foot: Secondary | ICD-10-CM | POA: Diagnosis not present

## 2022-09-04 ENCOUNTER — Ambulatory Visit (INDEPENDENT_AMBULATORY_CARE_PROVIDER_SITE_OTHER): Payer: BC Managed Care – PPO

## 2022-09-04 ENCOUNTER — Ambulatory Visit (INDEPENDENT_AMBULATORY_CARE_PROVIDER_SITE_OTHER): Payer: BC Managed Care – PPO | Admitting: Orthopaedic Surgery

## 2022-09-04 ENCOUNTER — Encounter: Payer: Self-pay | Admitting: Orthopaedic Surgery

## 2022-09-04 DIAGNOSIS — G8929 Other chronic pain: Secondary | ICD-10-CM

## 2022-09-04 DIAGNOSIS — M79671 Pain in right foot: Secondary | ICD-10-CM

## 2022-09-04 MED ORDER — DICLOFENAC SODIUM 75 MG PO TBEC: 75.0000 mg | DELAYED_RELEASE_TABLET | Freq: Two times a day (BID) | ORAL | 2 refills | Status: DC | PRN

## 2022-09-04 NOTE — Progress Notes (Signed)
Office Visit Note   Patient: Danielle Santiago           Date of Birth: 1971/01/17           MRN: [id number] Visit Date: 09/04/2022              Requested by: Fleet Contras, MD [address] [place],  [place] [address] PCP: Fleet Contras, MD   Assessment & Plan: Visit Diagnoses:  1. Chronic pain of right heel     Plan: Impression is right heel Haglund's deformity and Achilles tendinitis.  At this point, it sounds like patient has undergone pre-Achilles bursa injections several times with good but temporary relief.  She has not been immobilized in a cam boot nor has been to physical therapy.  I would like to try both at this time.  Referral has been made.  She will wean out of the boot as tolerated however if her symptoms do not improve over the next 4 to 6 weeks she will follow-up for recheck.  Call with concerns or questions in the meantime.  Follow-Up Instructions: Return if symptoms worsen or fail to improve.   Orders:  Orders Placed This Encounter  Procedures   XR Os Calcis Right   No orders of the defined types were placed in this encounter.     Procedures: No procedures performed   Clinical Data: No additional findings.   Subjective: Chief Complaint  Patient presents with   Right Ankle - Pain    HPI patient is a pleasant 51 year old female who comes in today with right heel pain for the past 1-1/2 to 2 years.  Symptoms started when she was going down a set of stairs and missed a step.  She has been seen by what sounds like podiatrist in the past where she has had what sounds like 3 Achilles cortisone injections which provided about 8 weeks relief.  Her last injection was in May or June of this past year.  The pain is returned and is constant.  Worse going from a seated to standing position as well as when she is applying pressure as when she is wearing shoes.  She has been taking Tylenol and using topical anti-inflammatories without relief.  She has not been in  a walking boot or has been to physical therapy.  Review of Systems as detailed in HPI.  All others reviewed and are negative.   Objective: Vital Signs: There were no vitals taken for this visit.  Physical Exam well-developed well-nourished female no acute distress.  Alert and oriented x3.  Ortho Exam right heel reveals moderate tenderness to the distal Achilles.  She also has tenderness to the pre-Achilles bursa.  She can dorsiflex to neutral.  No pain with plantarflexion, inversion or eversion.  She is neurovascular intact distally.  Specialty Comments:  No specialty comments available.  Imaging: XR Os Calcis Right  Result Date: 09/04/2022 X-rays demonstrate retrocalcaneal spur in addition to an osteophyte at the plantar fascia insertion at the heel    PMFS History: There are no problems to display for this patient.  History reviewed. No pertinent past medical history.  Family History  Problem Relation Age of Onset   Diabetes Father     History reviewed. No pertinent surgical history. Social History   Occupational History   Not on file  Tobacco Use   Smoking status: Never   Smokeless tobacco: Never  Vaping Use   Vaping Use: Never used  Substance and Sexual Activity  Alcohol use: No   Drug use: No   Sexual activity: Yes    Partners: Male    Birth control/protection: None

## 2022-09-17 ENCOUNTER — Ambulatory Visit (INDEPENDENT_AMBULATORY_CARE_PROVIDER_SITE_OTHER): Payer: BC Managed Care – PPO | Admitting: Rehabilitative and Restorative Service Providers"

## 2022-09-17 ENCOUNTER — Encounter: Payer: Self-pay | Admitting: Rehabilitative and Restorative Service Providers"

## 2022-09-17 ENCOUNTER — Other Ambulatory Visit: Payer: Self-pay

## 2022-09-17 DIAGNOSIS — M6281 Muscle weakness (generalized): Secondary | ICD-10-CM | POA: Diagnosis not present

## 2022-09-17 DIAGNOSIS — R262 Difficulty in walking, not elsewhere classified: Secondary | ICD-10-CM

## 2022-09-17 DIAGNOSIS — M25571 Pain in right ankle and joints of right foot: Secondary | ICD-10-CM | POA: Diagnosis not present

## 2022-09-17 NOTE — Therapy (Signed)
OUTPATIENT PHYSICAL THERAPY EVALUATION   Patient Name: HANA TRIPPETT MRN: [id number] DOB:1971-04-04, 51 y.o., female Today's Date: 09/18/2022   PT End of Session - 09/17/22 1610     Visit Number 1    Number of Visits 20    Date for PT Re-Evaluation 11/26/22    Authorization Type [employer] 20%    Authorization - Number of Visits 24    PT Start Time 1608    PT Stop Time 1650    PT Time Calculation (min) 42 min    Activity Tolerance Patient tolerated treatment well    Behavior During Therapy [facility] for tasks assessed/performed             History reviewed. No pertinent past medical history. History reviewed. No pertinent surgical history. There are no problems to display for this patient.   PCP: Fleet Contras MD  REFERRING PROVIDER: Ivin Poot  REFERRING DIAG: [phone] (ICD-10-CM) - Chronic pain of right heel  THERAPY DIAG:  Pain in joint involving right ankle and foot  Muscle weakness (generalized)  Difficulty in walking, not elsewhere classified  Rationale for Evaluation and Treatment: Rehabilitation  ONSET DATE: Chronic complaints   SUBJECTIVE:   SUBJECTIVE STATEMENT: Pt indicated complaints for a while.  Pt indicated she started having pain in posterior heel on Rt.  Pt indicated seeing foot doctor with injections 2x with relief for 6-8 weeks then pain returned.  Wears cam walker boot since ortho MD visit with some improvements.   PERTINENT HISTORY: Haglunds deformity/achilles tendonitis  PAIN:  NPRS scale: at current 3/10 , at worst 9/10 Pain location: Rt foot/ankle Pain description: sharp at times, throbbing Aggravating factors: initial walking in morning and after sitting. Worse after prolonged walking approx. 15, stairs Relieving factors: medicine, previous injections, rest  PRECAUTIONS: None  WEIGHT BEARING RESTRICTIONS: No  FALLS:  Has patient fallen in last 6 months? No  LIVING ENVIRONMENT:  Lives in:  House/apartment Stairs: 13 stairs   OCCUPATION: Work on Animator, some walking  PLOF: Independent, walking 3x/week for exercise, cooking.   PATIENT GOALS: Reduce pain, make it go ahead   OBJECTIVE:   PATIENT SURVEYS:  09/17/2022 FOTO intake: 67   predicted:  75  COGNITION: 09/17/2022 Overall cognitive status: WFL    SENSATION: 09/17/2022 WFL  POSTURE:  09/17/2022 : flat foot bilateral  PALPATION: 09/17/2022 Tenderness noted in posterior heel around achilles upon insertion in calcaneus.   LOWER EXTREMITY ROM:   AROM Right 09/17/2022 Left 09/17/2022  Hip flexion    Hip extension    Hip abduction    Hip adduction    Hip internal rotation    Hip external rotation    Knee flexion    Knee extension    Ankle dorsiflexion 9 in 90 deg knee flexion,  2 in 0 deg knee flexion 12 in 90 deg knee flexion   10 in 0 deg knee flexion  Ankle plantarflexion 45 58  Ankle inversion    Ankle eversion     (Blank rows = not tested)  LOWER EXTREMITY MMT:  MMT Right 09/17/2022 Left 09/17/2022  Hip flexion    Hip extension    Hip abduction    Hip adduction    Hip internal rotation    Hip external rotation    Knee flexion    Knee extension    Ankle dorsiflexion 4/5 5/5  Ankle plantarflexion 2+/5 c pain 4/5  Ankle inversion 4/5 5/5  Ankle eversion 4/5 5/5   (Blank rows =  not tested)  LOWER EXTREMITY SPECIAL TESTS: 09/17/2022  No specific testing performed today  FUNCTIONAL TESTS:  09/17/2022 Lt SLS: 10 seconds Rt SLS: unable unassisted on Rt, c pain  GAIT: 09/17/2022 Independent ambulation   TODAY'S TREATMENT                                                                          DATE: 09/17/2022 Therex:    HEP instruction/performance c cues for techniques, handout provided.  Trial set performed of each for comprehension and symptom assessment.  See below for exercise list  Ionto Dexamethasone 1.0 mL 4-6 hour at home patch.  5 mins for set up and  instruction   PATIENT EDUCATION:  Education details: HEP, POC Person educated: Patient Education method: Explanation, Demonstration, Verbal cues, and Handouts Education comprehension: verbalized understanding, returned demonstration, and verbal cues required  HOME EXERCISE PROGRAM: Access Code: [place] URL: [email or web address] Date: 09/17/2022 Prepared by: Scot Jun  Exercises - Long Sitting Ankle Eversion with Resistance  - 2 x daily - 7 x weekly - 1-2 sets - 10-15 reps - Long Sitting Ankle Inversion with Resistance  - 2 x daily - 7 x weekly - 1-2 sets - 10-15 reps - Long Sitting Ankle Plantar Flexion with Resistance  - 2 x daily - 7 x weekly - 1-2 sets - 10-15 reps - Long Sitting Calf Stretch with Strap  - 2-3 x daily - 7 x weekly - 1 sets - 5 reps - 30 hold - Gastroc Stretch on Wall  - 2-3 x daily - 7 x weekly - 1 sets - 5 reps - 30 hold  ASSESSMENT:  CLINICAL IMPRESSION: Patient is a 51 y.o. who comes to clinic with complaints of Rt foot/heel pain with mobility, strength and movement coordination deficits that impair their ability to perform usual daily and recreational functional activities without increase difficulty/symptoms at this time.  Patient to benefit from skilled PT services to address impairments and limitations to improve to previous level of function without restriction secondary to condition.   OBJECTIVE IMPAIRMENTS: decreased activity tolerance, decreased coordination, decreased endurance, decreased mobility, difficulty walking, decreased ROM, decreased strength, increased fascial restrictions, impaired perceived functional ability, increased muscle spasms, impaired flexibility, improper body mechanics, and pain.   ACTIVITY LIMITATIONS: standing, squatting, stairs, transfers, and locomotion level  PARTICIPATION LIMITATIONS: interpersonal relationship, driving, shopping, community activity, occupation, and yard work  PERSONAL FACTORS: Time  since onset of injury/illness/exacerbation are also affecting patient's functional outcome.   REHAB POTENTIAL: Good  CLINICAL DECISION MAKING: Stable/uncomplicated  EVALUATION COMPLEXITY: Low   GOALS: Goals reviewed with patient? Yes  SHORT TERM GOALS: (target date for Short term goals are 3 weeks 10/08/2022)   1.  Patient will demonstrate independent use of home exercise program to maintain progress from in clinic treatments.  Goal status: New  LONG TERM GOALS: (target dates for all long term goals are 10 weeks  11/26/2022 )   1. Patient will demonstrate/report pain at worst less than or equal to 2/10 to facilitate minimal limitation in daily activity secondary to pain symptoms.  Goal status: New   2. Patient will demonstrate independent use of home exercise program to facilitate ability to maintain/progress functional gains from skilled physical therapy  services.  Goal status: New   3. Patient will demonstrate FOTO outcome > or = 75 % to indicate reduced disability due to condition.  Goal status: New   4.  Patient will demonstrate Rt LE MMT 5/5 throughout to faciltiate usual transfers, stairs, squatting at [facility] for daily life.   Goal status: New   5.  Patient will demonstrate bilateral ankle DF > or = 10 degrees to facilitate heel toe ambulation at PLOF.  Goal status: New   6.  Patient will demonstrate bilateral SLS > 15 seconds to facilitate stability in ambulation on even and uneven surface.  Goal status: New   7.  Patient will demonstrate ability to ascend/descend stairs reciprocally with handrail assist for household navigation.  Goal Status: New   PLAN:  PT FREQUENCY: 1-2x/week  PT DURATION: 10 weeks  PLANNED INTERVENTIONS: Therapeutic exercises, Therapeutic activity, Neuro Muscular re-education, Balance training, Gait training, Patient/Family education, Joint mobilization, Stair training, DME instructions, Dry Needling, Electrical stimulation, Traction,  Cryotherapy, vasopneumatic deviceMoist heat, Taping, Ultrasound, Ionotophoresis 4mg /ml Dexamethasone, and Manual therapy.  All included unless contraindicated  PLAN FOR NEXT SESSION: Review HEP knowledge/results.  Ionto.  Eccentric loading PF. Improve DF.    Scot Jun, PT, DPT, OCS, ATC 09/18/22  7:49 AM

## 2022-09-18 ENCOUNTER — Encounter: Payer: Self-pay | Admitting: Rehabilitative and Restorative Service Providers"

## 2022-09-25 ENCOUNTER — Encounter: Payer: Self-pay | Admitting: Rehabilitative and Restorative Service Providers"

## 2022-09-25 ENCOUNTER — Ambulatory Visit (INDEPENDENT_AMBULATORY_CARE_PROVIDER_SITE_OTHER): Payer: BC Managed Care – PPO | Admitting: Rehabilitative and Restorative Service Providers"

## 2022-09-25 DIAGNOSIS — M6281 Muscle weakness (generalized): Secondary | ICD-10-CM

## 2022-09-25 DIAGNOSIS — R262 Difficulty in walking, not elsewhere classified: Secondary | ICD-10-CM | POA: Diagnosis not present

## 2022-09-25 DIAGNOSIS — M25571 Pain in right ankle and joints of right foot: Secondary | ICD-10-CM | POA: Diagnosis not present

## 2022-09-25 NOTE — Therapy (Addendum)
OUTPATIENT PHYSICAL THERAPY TREATMENT/ DISCHARGE   Patient Name: Danielle Santiago MRN: [id number] DOB:May 05, 1971, 51 y.o., female Today's Date: 09/25/2022   PT End of Session - 09/25/22 1429     Visit Number 2    Number of Visits 20    Date for PT Re-Evaluation 11/26/22    Authorization Type [employer] 20%    Authorization - Number of Visits 24    PT Start Time 1429    PT Stop Time 1509    PT Time Calculation (min) 40 min    Activity Tolerance Patient tolerated treatment well    Behavior During Therapy [facility] for tasks assessed/performed              History reviewed. No pertinent past medical history. History reviewed. No pertinent surgical history. There are no problems to display for this patient.   PCP: Fleet Contras MD  REFERRING PROVIDER: Ivin Poot  REFERRING DIAG: [phone] (ICD-10-CM) - Chronic pain of right heel  THERAPY DIAG:  Pain in joint involving right ankle and foot  Muscle weakness (generalized)  Difficulty in walking, not elsewhere classified  Rationale for Evaluation and Treatment: Rehabilitation  ONSET DATE: Chronic complaints   SUBJECTIVE:   SUBJECTIVE STATEMENT: Pt indicated pain wasn't as bad as before with activity.  Pt indicated feeling like the stretching is helping.  Reported patch didn't stay on very well.   PERTINENT HISTORY: Haglunds deformity/achilles tendonitis  PAIN:  NPRS scale: 4-5/10  Pain location: Rt foot/ankle Pain description: sharp at times, throbbing Aggravating factors: initial walking in morning and after sitting. Worse after prolonged walking approx. 15, stairs Relieving factors: medicine, previous injections, rest  PRECAUTIONS: None  WEIGHT BEARING RESTRICTIONS: No  FALLS:  Has patient fallen in last 6 months? No  LIVING ENVIRONMENT:  Lives in: House/apartment Stairs: 13 stairs   OCCUPATION: Work on An[employer], some walking  PLOF: Independent, walking 3x/week for exercise, cooking.    PATIENT GOALS: Reduce pain, make it go ahead   OBJECTIVE:   PATIENT SURVEYS:  09/17/2022 FOTO intake: 67   predicted:  75  COGNITION: 09/17/2022 Overall cognitive status: WFL    SENSATION: 09/17/2022 WFL  POSTURE:  09/17/2022 : flat foot bilateral  PALPATION: 09/17/2022 Tenderness noted in posterior heel around achilles upon insertion in calcaneus.   LOWER EXTREMITY ROM:   AROM Right 09/17/2022 Left 09/17/2022 Right 09/25/2022  Hip flexion     Hip extension     Hip abduction     Hip adduction     Hip internal rotation     Hip external rotation     Knee flexion     Knee extension     Ankle dorsiflexion 9 in 90 deg knee flexion,  2 in 0 deg knee flexion 12 in 90 deg knee flexion   10 in 0 deg knee flexion 12 in 90 deg knee flexion,  12 in 0 deg knee flexion  Ankle plantarflexion 45 58   Ankle inversion     Ankle eversion      (Blank rows = not tested)  LOWER EXTREMITY MMT:  MMT Right 09/17/2022 Left 09/17/2022  Hip flexion    Hip extension    Hip abduction    Hip adduction    Hip internal rotation    Hip external rotation    Knee flexion    Knee extension    Ankle dorsiflexion 4/5 5/5  Ankle plantarflexion 2+/5 c pain 4/5  Ankle inversion 4/5 5/5  Ankle eversion 4/5 5/5   (  Blank rows = not tested)  LOWER EXTREMITY SPECIAL TESTS: 09/17/2022  No specific testing performed today  FUNCTIONAL TESTS:  09/17/2022 Lt SLS: 10 seconds Rt SLS: unable unassisted on Rt, c pain  GAIT: 09/17/2022 Independent ambulation   TODAY'S TREATMENT                                                                          DATE: 09/25/2022 Therex:    Nustep lvl 5 UE/LE 8 mins total  Incline calf stretch gastroc bilateral 30 sec x 5 (no shoes)  Standing PF double leg up, Rt leg biased slow lowering 2 x 10 (added to home)  DF AROM x 5 prior to ROM measurement   Verbal review of HEP from eval.   Neuro Re-ed  Tandem stance 1 min x 2 bilateral (cues for home  use)  Ionto Dexamethasone 1.0 mL 4-6 hour at home patch.  5 mins for set up c extra time for cleaning and extra taping.   TODAY'S TREATMENT                                                                          DATE: 09/17/2022 Therex:    HEP instruction/performance c cues for techniques, handout provided.  Trial set performed of each for comprehension and symptom assessment.  See below for exercise list  Ionto Dexamethasone 1.0 mL 4-6 hour at home patch.  5 mins for set up and instruction   PATIENT EDUCATION:  Education details: HEP, POC Person educated: Patient Education method: Explanation, Demonstration, Verbal cues, and Handouts Education comprehension: verbalized understanding, returned demonstration, and verbal cues required  HOME EXERCISE PROGRAM: Access Code: [email or web address] URL: [email or web address] Date: 09/17/2022 Prepared by: Chyrel Masson  Exercises - Long Sitting Ankle Eversion with Resistance  - 2 x daily - 7 x weekly - 1-2 sets - 10-15 reps - Long Sitting Ankle Inversion with Resistance  - 2 x daily - 7 x weekly - 1-2 sets - 10-15 reps - Long Sitting Ankle Plantar Flexion with Resistance  - 2 x daily - 7 x weekly - 1-2 sets - 10-15 reps - Long Sitting Calf Stretch with Strap  - 2-3 x daily - 7 x weekly - 1 sets - 5 reps - 30 hold - Gastroc Stretch on Wall  - 2-3 x daily - 7 x weekly - 1 sets - 5 reps - 30 hold  Tandem stance and eccentric PF loading in standing added to home (couldn't get medbridge working )  ASSESSMENT:  CLINICAL IMPRESSION: Pt reported positive improvement in time since last visit.  Good HEP recall upon review today.  Tried ionto again with hopes of patch staying on better (fell off last time due to lotion/skin oils).   Skilled PT services continued recommended at this time.   OBJECTIVE IMPAIRMENTS: decreased activity tolerance, decreased coordination, decreased endurance, decreased mobility, difficulty walking, decreased ROM,  decreased strength, increased fascial restrictions, impaired perceived functional ability, increased muscle  spasms, impaired flexibility, improper body mechanics, and pain.   ACTIVITY LIMITATIONS: standing, squatting, stairs, transfers, and locomotion level  PARTICIPATION LIMITATIONS: interpersonal relationship, driving, shopping, community activity, occupation, and yard work  PERSONAL FACTORS: Time since onset of injury/illness/exacerbation are also affecting patient's functional outcome.   REHAB POTENTIAL: Good  CLINICAL DECISION MAKING: Stable/uncomplicated  EVALUATION COMPLEXITY: Low   GOALS: Goals reviewed with patient? Yes  SHORT TERM GOALS: (target date for Short term goals are 3 weeks 10/08/2022)   1.  Patient will demonstrate independent use of home exercise program to maintain progress from in clinic treatments.  Goal status: on going 09/25/2022  LONG TERM GOALS: (target dates for all long term goals are 10 weeks  11/26/2022 )   1. Patient will demonstrate/report pain at worst less than or equal to 2/10 to facilitate minimal limitation in daily activity secondary to pain symptoms.  Goal status: New   2. Patient will demonstrate independent use of home exercise program to facilitate ability to maintain/progress functional gains from skilled physical therapy services.  Goal status: New   3. Patient will demonstrate FOTO outcome > or = 75 % to indicate reduced disability due to condition.  Goal status: New   4.  Patient will demonstrate Rt LE MMT 5/5 throughout to faciltiate usual transfers, stairs, squatting at [facility] for daily life.   Goal status: New   5.  Patient will demonstrate bilateral ankle DF > or = 10 degrees to facilitate heel toe ambulation at PLOF.  Goal status: New   6.  Patient will demonstrate bilateral SLS > 15 seconds to facilitate stability in ambulation on even and uneven surface.  Goal status: New   7.  Patient will demonstrate ability to  ascend/descend stairs reciprocally with handrail assist for household navigation.  Goal Status: New   PLAN:  PT FREQUENCY: 1-2x/week  PT DURATION: 10 weeks  PLANNED INTERVENTIONS: Therapeutic exercises, Therapeutic activity, Neuro Muscular re-education, Balance training, Gait training, Patient/Family education, Joint mobilization, Stair training, DME instructions, Dry Needling, Electrical stimulation, Traction, Cryotherapy, vasopneumatic deviceMoist heat, Taping, Ultrasound, Ionotophoresis 4mg /ml Dexamethasone, and Manual therapy.  All included unless contraindicated  PLAN FOR NEXT SESSION: Recheck strength in PF.   Progressive eccentric loading ( single leg?)   , PT, DPT, OCS, ATC 09/25/22  3:12 PM  PHYSICAL THERAPY DISCHARGE SUMMARY  Visits from Start of Care: 2  Current functional level related to goals / functional outcomes: See note   Remaining deficits: See note   Education / Equipment: HEP  Patient goals were partially met. Patient is being discharged due to not returning since the last visit.  09/27/22, PT, DPT, OCS, ATC 11/15/22  10:54 AM

## 2022-10-02 ENCOUNTER — Encounter: Payer: BC Managed Care – PPO | Admitting: Rehabilitative and Restorative Service Providers"

## 2022-10-04 ENCOUNTER — Encounter: Payer: BC Managed Care – PPO | Admitting: Rehabilitative and Restorative Service Providers"

## 2022-10-08 ENCOUNTER — Telehealth: Payer: Self-pay | Admitting: Rehabilitative and Restorative Service Providers"

## 2022-10-08 ENCOUNTER — Encounter: Payer: BC Managed Care – PPO | Admitting: Rehabilitative and Restorative Service Providers"

## 2022-10-08 NOTE — Telephone Encounter (Signed)
Pt indicated she didn't feel well today and forgot to call and cancel. Reminded her of next appointment.   Chyrel Masson, PT, DPT, OCS, ATC 10/08/22  4:17 PM

## 2022-10-08 NOTE — Therapy (Incomplete)
OUTPATIENT PHYSICAL THERAPY TREATMENT   Patient Name: Danielle Santiago MRN: [id number] DOB:July 06, 1971, 51 y.o., female Today's Date: 10/08/2022      No past medical history on file. No past surgical history on file. There are no problems to display for this patient.   PCP: Fleet Contras MD  REFERRING PROVIDER: Cristie Hem, PA-C  REFERRING DIAG: [phone] (ICD-10-CM) - Chronic pain of right heel  THERAPY DIAG:  No diagnosis found.  Rationale for Evaluation and Treatment: Rehabilitation  ONSET DATE: Chronic complaints   SUBJECTIVE:   SUBJECTIVE STATEMENT: Pt indicated pain wasn't as bad as before with activity.  Pt indicated feeling like the stretching is helping.  Reported patch didn't stay on very well.   PERTINENT HISTORY: Haglunds deformity/achilles tendonitis  PAIN:  NPRS scale: 4-5/10  Pain location: Rt foot/ankle Pain description: sharp at times, throbbing Aggravating factors: initial walking in morning and after sitting. Worse after prolonged walking approx. 15, stairs Relieving factors: medicine, previous injections, rest  PRECAUTIONS: None  WEIGHT BEARING RESTRICTIONS: No  FALLS:  Has patient fallen in last 6 months? No  LIVING ENVIRONMENT:  Lives in: House/apartment Stairs: 13 stairs   OCCUPATION: Work on Anim[employer], some walking  PLOF: Independent, walking 3x/week for exercise, cooking.   PATIENT GOALS: Reduce pain, make it go ahead   OBJECTIVE:   PATIENT SURVEYS:  09/17/2022 FOTO intake: 67   predicted:  75  COGNITION: 09/17/2022 Overall cognitive status: WFL    SENSATION: 09/17/2022 WFL  POSTURE:  09/17/2022 : flat foot bilateral  PALPATION: 09/17/2022 Tenderness noted in posterior heel around achilles upon insertion in calcaneus.   LOWER EXTREMITY ROM:   AROM Right 09/17/2022 Left 09/17/2022 Right 09/25/2022  Hip flexion     Hip extension     Hip abduction     Hip adduction     Hip internal rotation      Hip external rotation     Knee flexion     Knee extension     Ankle dorsiflexion 9 in 90 deg knee flexion,  2 in 0 deg knee flexion 12 in 90 deg knee flexion   10 in 0 deg knee flexion 12 in 90 deg knee flexion,  12 in 0 deg knee flexion  Ankle plantarflexion 45 58   Ankle inversion     Ankle eversion      (Blank rows = not tested)  LOWER EXTREMITY MMT:  MMT Right 09/17/2022 Left 09/17/2022 Right 10/08/2022  Hip flexion     Hip extension     Hip abduction     Hip adduction     Hip internal rotation     Hip external rotation     Knee flexion     Knee extension     Ankle dorsiflexion 4/5 5/5   Ankle plantarflexion 2+/5 c pain 4/5   Ankle inversion 4/5 5/5   Ankle eversion 4/5 5/5    (Blank rows = not tested)  LOWER EXTREMITY SPECIAL TESTS: 09/17/2022  No specific testing performed today  FUNCTIONAL TESTS:  09/17/2022 Lt SLS: 10 seconds Rt SLS: unable unassisted on Rt, c pain  GAIT: 09/17/2022 Independent ambulation   TODAY'S TREATMENT  DATE: 10/08/2022 Therex:    Nustep lvl 5 UE/LE 8 mins total  Incline calf stretch gastroc bilateral 30 sec x 5 (no shoes)  Standing PF double leg up, Rt leg biased slow lowering 2 x 10 (added to home)  DF AROM x 5 prior to ROM measurement   Verbal review of HEP from eval.   Neuro Re-ed  Tandem stance 1 min x 2 bilateral (cues for home use)  Ionto Dexamethasone 1.0 mL 4-6 hour at home patch.  5 mins for set up c extra time for cleaning and extra taping.   TODAY'S TREATMENT                                                                          DATE: 09/25/2022 Therex:    Nustep lvl 5 UE/LE 8 mins total  Incline calf stretch gastroc bilateral 30 sec x 5 (no shoes)  Standing PF double leg up, Rt leg biased slow lowering 2 x 10 (added to home)  DF AROM x 5 prior to ROM measurement   Verbal review of HEP from eval.   Neuro Re-ed  Tandem stance 1 min  x 2 bilateral (cues for home use)  Ionto Dexamethasone 1.0 mL 4-6 hour at home patch.  5 mins for set up c extra time for cleaning and extra taping.   TODAY'S TREATMENT                                                                          DATE: 09/17/2022 Therex:    HEP instruction/performance c cues for techniques, handout provided.  Trial set performed of each for comprehension and symptom assessment.  See below for exercise list  Ionto Dexamethasone 1.0 mL 4-6 hour at home patch.  5 mins for set up and instruction   PATIENT EDUCATION:  Education details: HEP, POC Person educated: Patient Education method: Explanation, Demonstration, Verbal cues, and Handouts Education comprehension: verbalized understanding, returned demonstration, and verbal cues required  HOME EXERCISE PROGRAM: Access Code: [email or web address] URL: [email or web address] Date: 09/17/2022 Prepared by: Chyrel Masson  Exercises - Long Sitting Ankle Eversion with Resistance  - 2 x daily - 7 x weekly - 1-2 sets - 10-15 reps - Long Sitting Ankle Inversion with Resistance  - 2 x daily - 7 x weekly - 1-2 sets - 10-15 reps - Long Sitting Ankle Plantar Flexion with Resistance  - 2 x daily - 7 x weekly - 1-2 sets - 10-15 reps - Long Sitting Calf Stretch with Strap  - 2-3 x daily - 7 x weekly - 1 sets - 5 reps - 30 hold - Gastroc Stretch on Wall  - 2-3 x daily - 7 x weekly - 1 sets - 5 reps - 30 hold  Tandem stance and eccentric PF loading in standing added to home (couldn't get medbridge working )  ASSESSMENT:  CLINICAL IMPRESSION: Pt reported positive improvement in time since last visit.  Good HEP recall upon  review today.  Tried ionto again with hopes of patch staying on better (fell off last time due to lotion/skin oils).   Skilled PT services continued recommended at this time.   OBJECTIVE IMPAIRMENTS: decreased activity tolerance, decreased coordination, decreased endurance, decreased mobility, difficulty  walking, decreased ROM, decreased strength, increased fascial restrictions, impaired perceived functional ability, increased muscle spasms, impaired flexibility, improper body mechanics, and pain.   ACTIVITY LIMITATIONS: standing, squatting, stairs, transfers, and locomotion level  PARTICIPATION LIMITATIONS: interpersonal relationship, driving, shopping, community activity, occupation, and yard work  PERSONAL FACTORS: Time since onset of injury/illness/exacerbation are also affecting patient's functional outcome.   REHAB POTENTIAL: Good  CLINICAL DECISION MAKING: Stable/uncomplicated  EVALUATION COMPLEXITY: Low   GOALS: Goals reviewed with patient? Yes  SHORT TERM GOALS: (target date for Short term goals are 3 weeks 10/08/2022)   1.  Patient will demonstrate independent use of home exercise program to maintain progress from in clinic treatments.  Goal status: on going 09/25/2022  LONG TERM GOALS: (target dates for all long term goals are 10 weeks  11/26/2022 )   1. Patient will demonstrate/report pain at worst less than or equal to 2/10 to facilitate minimal limitation in daily activity secondary to pain symptoms.  Goal status: New   2. Patient will demonstrate independent use of home exercise program to facilitate ability to maintain/progress functional gains from skilled physical therapy services.  Goal status: New   3. Patient will demonstrate FOTO outcome > or = 75 % to indicate reduced disability due to condition.  Goal status: New   4.  Patient will demonstrate Rt LE MMT 5/5 throughout to faciltiate usual transfers, stairs, squatting at [facility] for daily life.   Goal status: New   5.  Patient will demonstrate bilateral ankle DF > or = 10 degrees to facilitate heel toe ambulation at PLOF.  Goal status: New   6.  Patient will demonstrate bilateral SLS > 15 seconds to facilitate stability in ambulation on even and uneven surface.  Goal status: New   7.  Patient will  demonstrate ability to ascend/descend stairs reciprocally with handrail assist for household navigation.  Goal Status: New   PLAN:  PT FREQUENCY: 1-2x/week  PT DURATION: 10 weeks  PLANNED INTERVENTIONS: Therapeutic exercises, Therapeutic activity, Neuro Muscular re-education, Balance training, Gait training, Patient/Family education, Joint mobilization, Stair training, DME instructions, Dry Needling, Electrical stimulation, Traction, Cryotherapy, vasopneumatic deviceMoist heat, Taping, Ultrasound, Ionotophoresis 4mg /ml Dexamethasone, and Manual therapy.  All included unless contraindicated  PLAN FOR NEXT SESSION: Recheck strength in PF.   Progressive eccentric loading ( single leg?)   , PT, DPT, OCS, ATC 10/08/22  3:38 PM

## 2022-10-11 ENCOUNTER — Encounter: Payer: BC Managed Care – PPO | Admitting: Rehabilitative and Restorative Service Providers"

## 2023-01-23 ENCOUNTER — Other Ambulatory Visit: Payer: Self-pay | Admitting: Internal Medicine

## 2023-01-23 DIAGNOSIS — Z1231 Encounter for screening mammogram for malignant neoplasm of breast: Secondary | ICD-10-CM

## 2023-03-08 ENCOUNTER — Ambulatory Visit
Admission: RE | Admit: 2023-03-08 | Discharge: 2023-03-08 | Disposition: A | Discharge status: Home / Self Care | Payer: BC Managed Care – PPO | Source: Ambulatory Visit | Attending: Internal Medicine | Admitting: Internal Medicine

## 2023-03-08 DIAGNOSIS — Z1231 Encounter for screening mammogram for malignant neoplasm of breast: Secondary | ICD-10-CM

## 2024-05-05 ENCOUNTER — Other Ambulatory Visit: Payer: Self-pay | Admitting: Internal Medicine

## 2024-05-05 DIAGNOSIS — Z1231 Encounter for screening mammogram for malignant neoplasm of breast: Secondary | ICD-10-CM

## 2024-05-13 ENCOUNTER — Ambulatory Visit (INDEPENDENT_AMBULATORY_CARE_PROVIDER_SITE_OTHER): Admitting: Orthopaedic Surgery

## 2024-05-13 ENCOUNTER — Encounter: Payer: Self-pay | Admitting: Orthopaedic Surgery

## 2024-05-13 ENCOUNTER — Ambulatory Visit (INDEPENDENT_AMBULATORY_CARE_PROVIDER_SITE_OTHER): Payer: Self-pay

## 2024-05-13 ENCOUNTER — Ambulatory Visit
Admission: RE | Admit: 2024-05-13 | Discharge: 2024-05-13 | Disposition: A | Discharge status: Home / Self Care | Source: Ambulatory Visit | Attending: Internal Medicine | Admitting: Internal Medicine

## 2024-05-13 ENCOUNTER — Ambulatory Visit: Payer: Self-pay

## 2024-05-13 DIAGNOSIS — M25562 Pain in left knee: Secondary | ICD-10-CM | POA: Diagnosis not present

## 2024-05-13 DIAGNOSIS — G8929 Other chronic pain: Secondary | ICD-10-CM

## 2024-05-13 DIAGNOSIS — M25561 Pain in right knee: Secondary | ICD-10-CM

## 2024-05-13 DIAGNOSIS — M170 Bilateral primary osteoarthritis of knee: Secondary | ICD-10-CM

## 2024-05-13 DIAGNOSIS — M7051 Other bursitis of knee, right knee: Secondary | ICD-10-CM | POA: Diagnosis not present

## 2024-05-13 DIAGNOSIS — Z1231 Encounter for screening mammogram for malignant neoplasm of breast: Secondary | ICD-10-CM

## 2024-05-13 MED ORDER — DICLOFENAC SODIUM 75 MG PO TBEC: 75.0000 mg | DELAYED_RELEASE_TABLET | Freq: Two times a day (BID) | ORAL | 2 refills | Status: DC

## 2024-05-13 NOTE — Progress Notes (Signed)
 Office Visit Note   Patient: Danielle Santiago           Date of Birth: 04/21/1971           MRN: [id number] Visit Date: 05/13/2024              Requested by: Shelda Atlas, MD [address] [place],  [place] [address] PCP: Shelda Atlas, MD   Assessment & Plan: Visit Diagnoses:  1. Chronic pain of both knees   2. Bilateral primary osteoarthritis of knee   3. Patellofemoral arthralgia of right knee   4. Pes anserinus bursitis of right knee     Plan: X-ray shows signs of developing bilateral knee osteoarthritis as well as right patellofemoral osteoarthritis. In addition to OA patient also has mild/moderate Pez anserine tendinitis/bursitis of the right knee. Discussed all treatment modalities. Patient wishes to treat conservatively at this time and we will send referral for her to begin physical therapy focusing on quad/hamstring strengthening. Declines any corticosteroid injections at this time.  Will also send in prescription for diclofenac  for pain management.  Also advised to use Voltaren  gel as needed.  Discussed weight loss as a modality to offload pressure of knee and hopefully prevent progression of osteoarthritis.  Patient agrees with treatment plan and has no further questions or concerns at this time.  Will return to clinic for reevaluation if pain is not improving in 6-12 weeks after initiation of physical therapy and other lifestyle modality adjustments to optimize for weight loss.  Follow-Up Instructions: No follow-ups on file.   Orders:  Orders Placed This Encounter  Procedures   XR KNEE 3 VIEW LEFT   XR KNEE 3 VIEW RIGHT   Ambulatory referral to Physical Therapy   Meds ordered this encounter  Medications   diclofenac  (VOLTAREN ) 75 MG EC tablet    Sig: Take 1 tablet (75 mg total) by mouth 2 (two) times daily.    Dispense:  30 tablet    Refill:  2      Procedures: No procedures performed   Clinical Data: No additional findings.   Subjective: Chief  Complaint  Patient presents with   Right Knee - Pain   Left Knee - Pain    HPI  Patient presents with bilateral knee pain, right worse than left that started around January of this year.  Patient denies any trauma or acute inciting events.  Since right knee is causing majority of pain she was to focus on that knee today.  Says she is having pain along the medial joint line as well as path of the Pes anserine tendons as well as bursa.  Prolonged exercise such as walking on treadmill causes pain to worsen.  Has been prescribed diclofenac  in the past but is taken sparingly due to stomach discomfort.  Denies any history of GI bleeds.  Has not tried any home exercise program or physical therapy in the past.  Review of Systems  Constitutional: Negative.   HENT: Negative.    Eyes: Negative.   Respiratory: Negative.    Cardiovascular: Negative.   Endocrine: Negative.   Musculoskeletal: Negative.   Neurological: Negative.   Hematological: Negative.   Psychiatric/Behavioral: Negative.    All other systems reviewed and are negative.    Objective: Vital Signs: There were no vitals taken for this visit.    Right Knee Exam   Muscle Strength  The patient has normal right knee strength.  Tenderness  The patient is experiencing tenderness in the pes anserinus  and medial joint line.  Range of Motion  Extension:  normal  Flexion:  normal   Tests  McMurray:  Medial - negative Lateral - negative Lachman:  Anterior - negative     Pivot shift: negative Patellar apprehension: negative  Other  Erythema: absent Sensation: normal   Left Knee Exam  Left knee exam is normal.      Specialty Comments:  No specialty comments available.  Imaging: XR KNEE 3 VIEW LEFT Result Date: 05/13/2024 X-rays of the left knee show moderate tricompartmental OA.    XR KNEE 3 VIEW RIGHT Result Date: 05/13/2024 X-rays of the right knee show moderate tricompartmental OA.      PMFS History: There  are no active problems to display for this patient.  History reviewed. No pertinent past medical history.  Family History  Problem Relation Age of Onset   Diabetes Father     History reviewed. No pertinent surgical history. Social History   Occupational History   Not on file  Tobacco Use   Smoking status: Never   Smokeless tobacco: Never  Vaping Use   Vaping status: Never Used  Substance and Sexual Activity   Alcohol use: No   Drug use: No   Sexual activity: Yes    Partners: Male    Birth control/protection: None

## 2024-05-31 NOTE — Therapy (Signed)
 OUTPATIENT PHYSICAL THERAPY LOWER EXTREMITY EVALUATION   Patient Name: Danielle Santiago MRN: [id number] DOB:1971/02/07, 53 y.o., female Today's Date: 06/01/2024  END OF SESSION:  PT End of Session - 06/01/24 1647     Visit Number 1    Number of Visits 17    Date for PT Re-Evaluation 07/27/24    Authorization Type BC[employer]    PT Start Time 1600    PT Stop Time 1642    PT Time Calculation (min) 42 min    Activity Tolerance Patient tolerated treatment well    Behavior During Therapy [facility] for tasks assessed/performed          History reviewed. No pertinent past medical history. History reviewed. No pertinent surgical history. There are no active problems to display for this patient.   PCP: Shelda Atlas, MD   REFERRING PROVIDER: Jerri Kay CHRISTELLA, MD   REFERRING DIAG: DX M25.561,M25.562,G89.29 (ICD-10-CM) - Chronic pain of both knees  THERAPY DIAG:  Chronic pain of both knees  Muscle weakness (generalized)  Difficulty in walking, not elsewhere classified  Edema, unspecified type  Rationale for Evaluation and Treatment: Rehabilitation  ONSET DATE: Jan 2025  SUBJECTIVE:   SUBJECTIVE STATEMENT: Pt c/o bilateral knee pain R>L. Pain started increasing in January.  Pain can increase with prolonged sitting or walking.  Pain can occur with sleeping.   PERTINENT HISTORY: No hx/o knee surgeries. PAIN:  Are you having pain? Yes: NPRS scale: 5/10-7/10 Pain location: medial R knee Pain description: dull , sharp Aggravating factors: kneeling to pray, prolonged sitting, walking and stairs Relieving factors: cream, has prescribed meds as needed   PRECAUTIONS: None  RED FLAGS: None   WEIGHT BEARING RESTRICTIONS: No  FALLS:  Has patient fallen in last 6 months? No  LIVING ENVIRONMENT: Lives with: lives with their spouse Lives in: House/apartment Stairs:  Has following equipment at home: None  OCCUPATION: resident care coordinator  PLOF: Independent  PATIENT GOALS:  Decrease pain with above aggravating factors  NEXT MD VISIT: unknown  OBJECTIVE:  Note: Objective measures were completed at Evaluation unless otherwise noted.  DIAGNOSTIC FINDINGS: X-rays of the left knee show moderate tricompartmental OA.   X-rays of the right knee show moderate tricompartmental OA.    PATIENT SURVEYS:  PSFS: THE PATIENT SPECIFIC FUNCTIONAL SCALE  Place score of 0-10 (0 = unable to perform activity and 10 = able to perform activity at the same level as before injury or problem)  Activity Date: 06/01/24    Sleeping on right side 2    2.walking pain free 3    3.sit to stand pain free after prolonged sitting 3    4.      Total Score 2.66      Total Score = Sum of activity scores/number of activities  Minimally Detectable Change: 3 points (for single activity); 2 points (for average score)  [facility] (nd). The Patient Specific Functional Scale . Retrieved from [email or web address]   COGNITION: Overall cognitive status: Within functional limits for tasks assessed     SENSATION: WFL  EDEMA:  Occasional   MUSCLE LENGTH: Hamstrings: Right 70 deg; Left 70 deg   POSTURE: No Significant postural limitations  PALPATION: 2+ tenderness R medial jt line, 1+ L  LOWER EXTREMITY ROM:   ROM Right eval Left eval  Hip flexion    Hip extension    Hip abduction    Hip adduction    Hip internal rotation    Hip external rotation  Knee flexion 95A/112P 100A/114 P  Knee extension -5P/0A 0A  Ankle dorsiflexion    Ankle plantarflexion    Ankle inversion    Ankle eversion     (Blank rows = not tested)  LOWER EXTREMITY MMT:  MMT Right eval Left eval  Hip flexion 4 4  Hip extension    Hip abduction 4- 4-  Hip adduction    Hip internal rotation    Hip external rotation    Knee flexion 4+ 4+  Knee extension 4- 4  Ankle dorsiflexion    Ankle plantarflexion    Ankle inversion    Ankle  eversion     (Blank rows = not tested)  LOWER EXTREMITY SPECIAL TESTS:  Drop neg bilaterally  FUNCTIONAL TESTS: NT   GAIT: Distance walked: Medical sales representative device utilized: None Level of assistance: Complete Independence Comments:                                                                                                                                 TREATMENT DATE: 06/01/24 Evaluation; HEP instruction and demonstration by PT and patient with corrections; modifications for home exercise and transfers discussed  PATIENT EDUCATION:  Education details: HEP completed and reviewed verbally and by demo Person educated: Patient Education method: Explanation, Demonstration, Tactile cues, and Verbal cues Education comprehension: verbalized understanding, returned demonstration, and verbal cues required  HOME EXERCISE PROGRAM: Access Code: [email or web address] URL: [email or web address] Date: 06/01/2024 Prepared by: Burnard Meth  Exercises - Supine Quad Set  - 2 x daily - 7 x weekly - 2 sets - 10 reps - Supine Heel Slides  - 2 x daily - 7 x weekly - 2 sets - 10 reps - Supine Active Straight Leg Raise  - 2 x daily - 7 x weekly - 2 sets - 10 reps - Hooklying Clamshell with Resistance  - 2 x daily - 7 x weekly - 2 sets - 10 reps  ASSESSMENT:  CLINICAL IMPRESSION: Patient is a 53 y.o. female who was seen today for physical therapy evaluation and treatment for bilateral knee pain due to OA.  She presents with decreased ROM, decreased strength, altered gait, painful transfers and pain with sleeping.  She would benefit from skilled PT to address these deficits and return to pain free transfers, sleeping and community ambulation.    OBJECTIVE IMPAIRMENTS: Abnormal gait, decreased mobility, difficulty walking, decreased ROM, decreased strength, increased edema, impaired flexibility, and pain.   ACTIVITY LIMITATIONS: sitting, standing, squatting, sleeping, stairs, transfers, and  bed mobility  PARTICIPATION LIMITATIONS: cleaning, laundry, community activity, and church  PERSONAL FACTORS: Fitness are also affecting patient's functional outcome.   REHAB POTENTIAL: Good  CLINICAL DECISION MAKING: Stable/uncomplicated  EVALUATION COMPLEXITY: Moderate   GOALS: Goals reviewed with patient? Yes  SHORT TERM GOALS: Target date: 06/29/2024   Patient to be independent in HEP in 1 to 2 weeks Baseline: Goal status: INITIAL  2.  Decrease pain by 1 level.  Baseline:  Goal status: INITIAL  LONG TERM GOALS: Target date: 07/27/24  Patient to be independent in self progressive HEP. Baseline:  Goal status: INITIAL  2.  Increase strength by 1/2 to 1 full muscle grade in stabilization musculature in lower extremities. Baseline: 4- to 4 Goal status: INITIAL  3.  Decrease max pain to 2 out of 10 or less. Baseline: 7/10 Goal status: INITIAL  4.  Increase active flexion by 5+ degrees bilaterally. Baseline:  Goal status: INITIAL  5.  Able to kneel on small padded mat for praying at least 2x a day, working up to 5x a day with minimal pain. Baseline: Has been sitting or using thick padding Goal status: INITIAL  6.  Able to ambulate 20 minutes in grocery store with knee pain rated as 2/10 or less. Baseline: 6/10 Goal status: INITIAL   PLAN:  PT FREQUENCY: 2x/week  PT DURATION: 8 weeks  PLANNED INTERVENTIONS: 97164- PT Re-evaluation, 97110-Therapeutic exercises, 97530- Therapeutic activity, 97112- Neuromuscular re-education, 97535- Self Care, 02859- Manual therapy, [id number]- Aquatic Therapy, G0283- Electrical stimulation (unattended), 97016- Vasopneumatic device, Patient/Family education, Balance training, Joint mobilization, Cryotherapy, and Moist heat  PLAN FOR NEXT SESSION: Start with Rec bike and increase there ex   Burnard CHRISTELLA Meth, PT 06/01/2024, 4:49 PM

## 2024-06-01 ENCOUNTER — Other Ambulatory Visit: Payer: Self-pay

## 2024-06-01 ENCOUNTER — Ambulatory Visit (INDEPENDENT_AMBULATORY_CARE_PROVIDER_SITE_OTHER)

## 2024-06-01 DIAGNOSIS — M6281 Muscle weakness (generalized): Secondary | ICD-10-CM

## 2024-06-01 DIAGNOSIS — M25561 Pain in right knee: Secondary | ICD-10-CM | POA: Diagnosis not present

## 2024-06-01 DIAGNOSIS — R262 Difficulty in walking, not elsewhere classified: Secondary | ICD-10-CM | POA: Diagnosis not present

## 2024-06-01 DIAGNOSIS — M25562 Pain in left knee: Secondary | ICD-10-CM

## 2024-06-01 DIAGNOSIS — R609 Edema, unspecified: Secondary | ICD-10-CM | POA: Diagnosis not present

## 2024-06-01 DIAGNOSIS — G8929 Other chronic pain: Secondary | ICD-10-CM

## 2024-06-10 ENCOUNTER — Encounter: Payer: Self-pay | Admitting: Rehabilitative and Restorative Service Providers"

## 2024-06-10 ENCOUNTER — Ambulatory Visit: Admitting: Rehabilitative and Restorative Service Providers"

## 2024-06-10 DIAGNOSIS — R609 Edema, unspecified: Secondary | ICD-10-CM

## 2024-06-10 DIAGNOSIS — M25561 Pain in right knee: Secondary | ICD-10-CM | POA: Diagnosis not present

## 2024-06-10 DIAGNOSIS — M6281 Muscle weakness (generalized): Secondary | ICD-10-CM

## 2024-06-10 DIAGNOSIS — G8929 Other chronic pain: Secondary | ICD-10-CM

## 2024-06-10 DIAGNOSIS — M25562 Pain in left knee: Secondary | ICD-10-CM

## 2024-06-10 DIAGNOSIS — R262 Difficulty in walking, not elsewhere classified: Secondary | ICD-10-CM

## 2024-06-10 NOTE — Therapy (Addendum)
 OUTPATIENT PHYSICAL THERAPY LOWER EXTREMITY TREATMENT  PHYSICAL THERAPY DISCHARGE SUMMARY  Visits from Start of Care: 2  Current functional level related to goals / functional outcomes: See note   Remaining deficits: See note   Education / Equipment: HEP   Patient agrees to discharge. Patient goals were established. Patient is being discharged due to not returning since the last visit.   Patient Name: Danielle Santiago MRN: [id number] DOB:[birth year], 53 y.o., female Today's Date: 09/30/2024  END OF SESSION:     History reviewed. No pertinent past medical history. History reviewed. No pertinent surgical history. There are no active problems to display for this patient.   PCP: Shelda Atlas, MD   REFERRING PROVIDER: Jerri Kay CHRISTELLA, MD   REFERRING DIAG: DX M25.561,M25.562,G89.29 (ICD-10-CM) - Chronic pain of both knees  THERAPY DIAG:  Chronic pain of both knees  Muscle weakness (generalized)  Difficulty in walking, not elsewhere classified  Edema, unspecified type  Rationale for Evaluation and Treatment: Rehabilitation  ONSET DATE: Jan 2025  SUBJECTIVE:   SUBJECTIVE STATEMENT: Pt c/o bilateral knee pain R>L. Pain started increasing in January.  Pain can increase with prolonged sitting or walking.  Pain can occur with sleeping.   PERTINENT HISTORY: No hx/o knee surgeries. PAIN:  Are you having pain? Yes: NPRS scale: 5/10-7/10 Pain location: medial R knee Pain description: dull , sharp Aggravating factors: kneeling to pray, prolonged sitting, walking and stairs Relieving factors: cream, has prescribed meds as needed   PRECAUTIONS: None  RED FLAGS: None   WEIGHT BEARING RESTRICTIONS: No  FALLS:  Has patient fallen in last 6 months? No  LIVING ENVIRONMENT: Lives with: lives with their spouse Lives in: House/apartment Stairs:  Has following equipment at home: None  OCCUPATION: resident care coordinator  PLOF: Independent  PATIENT GOALS:  Decrease pain with above aggravating factors  NEXT MD VISIT: unknown  OBJECTIVE:  Note: Objective measures were completed at Evaluation unless otherwise noted.  DIAGNOSTIC FINDINGS: X-rays of the left knee show moderate tricompartmental OA.   X-rays of the right knee show moderate tricompartmental OA.    PATIENT SURVEYS:  PSFS: THE PATIENT SPECIFIC FUNCTIONAL SCALE  Place score of 0-10 (0 = unable to perform activity and 10 = able to perform activity at the same level as before injury or problem)  Activity Date: 06/01/24    Sleeping on right side 2    2.walking pain free 3    3.sit to stand pain free after prolonged sitting 3    4.      Total Score 2.66      Total Score = Sum of activity scores/number of activities  Minimally Detectable Change: 3 points (for single activity); 2 points (for average score)  [employer] [facility] (nd). The Patient Specific Functional Scale . Retrieved from [email or web address]   COGNITION: Overall cognitive status: Within functional limits for tasks assessed     SENSATION: WFL  EDEMA:  Occasional   MUSCLE LENGTH: Hamstrings: Right 70 deg; Left 70 deg   POSTURE: No Significant postural limitations  PALPATION: 2+ tenderness R medial jt line, 1+ L  LOWER EXTREMITY ROM:   ROM Right eval Left eval  Hip flexion    Hip extension    Hip abduction    Hip adduction    Hip internal rotation    Hip external rotation    Knee flexion 95A/112P 100A/114 P  Knee extension -5P/0A 0A  Ankle dorsiflexion    Ankle plantarflexion    Ankle inversion  Ankle eversion     (Blank rows = not tested)  LOWER EXTREMITY MMT:  MMT Right eval Left eval  Hip flexion 4 4  Hip extension    Hip abduction 4- 4-  Hip adduction    Hip internal rotation    Hip external rotation    Knee flexion 4+ 4+  Knee extension 4- 4  Ankle dorsiflexion    Ankle plantarflexion    Ankle inversion    Ankle  eversion     (Blank rows = not tested)  LOWER EXTREMITY SPECIAL TESTS:  Drop neg bilaterally  FUNCTIONAL TESTS: NT   GAIT: Distance walked: Medical Sales Representative device utilized: None Level of assistance: Complete Independence Comments:                                                                                                                                 TREATMENT DATE:  06/10/2024 Recumbent Bike Seat 4 for 5 minutes Level 3 (a bit tight, but she can pedal around, recommend NOT moving the seat closer yet) Supine heel slides 10 x 5 seconds each side with a quad set between each rep (20 quad sets with this activity) Quadriceps sets with 2 pillows under knees to avoid hyper-extension 10 x 5 seconds (do both at the same time) Hook lying clamshell with Green [employer]ra-Band 2 sets of 10 for 5 seconds Seated straight leg raises 3 sets of 5 with 1# (focus on pausing before lifting 4-6 inches and slow eccentric control while maintaining a good quad contraction)  97535: Education on avoiding hyper-extension with standing, walking and fatigue; importance of consistent HEP compliance; emphasis on quadriceps strength for long-term management   06/01/24 Evaluation; HEP instruction and demonstration by PT and patient with corrections; modifications for home exercise and transfers discussed  PATIENT EDUCATION:  Education details: HEP completed and reviewed verbally and by demo Person educated: Patient Education method: Explanation, Demonstration, Tactile cues, and Verbal cues Education comprehension: verbalized understanding, returned demonstration, and verbal cues required  HOME EXERCISE PROGRAM: Access Code: [email or web address] URL: [email or web address] Date: 06/01/2024 Prepared by: Burnard Meth  Exercises - Supine Quad Set  - 2 x daily - 7 x weekly - 2 sets - 10 reps - Supine Heel Slides  - 2 x daily - 7 x weekly - 2 sets - 10 reps - Supine Active Straight Leg Raise  - 2 x daily -  7 x weekly - 2 sets - 10 reps - Hooklying Clamshell with Resistance  - 2 x daily - 7 x weekly - 2 sets - 10 reps  ASSESSMENT:  CLINICAL IMPRESSION: Catheryn already notes progress with her day 1 HEP.  We reviewed her day 1 home exercise program with emphasis on correct technique and made a slight change to her straight leg raise exercise, moving her to a seated position to emphasize more quadriceps and less hip flexors activity.  We also discussed the importance of avoiding knee hyperextension, particularly  in weightbearing postures.  Mossie did a great job with all activities today and is an excellent candidate to meet long-term goals.  Patient is a 53 y.o. female who was seen today for physical therapy evaluation and treatment for bilateral knee pain due to OA.  She presents with decreased ROM, decreased strength, altered gait, painful transfers and pain with sleeping.  She would benefit from skilled PT to address these deficits and return to pain free transfers, sleeping and community ambulation.    OBJECTIVE IMPAIRMENTS: Abnormal gait, decreased mobility, difficulty walking, decreased ROM, decreased strength, increased edema, impaired flexibility, and pain.   ACTIVITY LIMITATIONS: sitting, standing, squatting, sleeping, stairs, transfers, and bed mobility  PARTICIPATION LIMITATIONS: cleaning, laundry, community activity, and church  PERSONAL FACTORS: Fitness are also affecting patient's functional outcome.   REHAB POTENTIAL: Good  CLINICAL DECISION MAKING: Stable/uncomplicated  EVALUATION COMPLEXITY: Moderate   GOALS: Goals reviewed with patient? Yes  SHORT TERM GOALS: Target date: 06/29/2024   Patient to be independent in HEP in 1 to 2 weeks Baseline: Goal status: Met 06/10/2024  2.  Decrease pain by 1 level. Baseline:  Goal status: INITIAL  LONG TERM GOALS: Target date: 07/27/24  Patient to be independent in self progressive HEP. Baseline:  Goal status: INITIAL  2.   Increase strength by 1/2 to 1 full muscle grade in stabilization musculature in lower extremities. Baseline: 4- to 4 Goal status: INITIAL  3.  Decrease max pain to 2 out of 10 or less. Baseline: 7/10 Goal status: INITIAL  4.  Increase active flexion by 5+ degrees bilaterally. Baseline:  Goal status: INITIAL  5.  Able to kneel on small padded mat for praying at least 2x a day, working up to 5x a day with minimal pain. Baseline: Has been sitting or using thick padding Goal status: INITIAL  6.  Able to ambulate 20 minutes in grocery store with knee pain rated as 2/10 or less. Baseline: 6/10 Goal status: INITIAL   PLAN:  PT FREQUENCY: 2x/week  PT DURATION: 8 weeks  PLANNED INTERVENTIONS: 97164- PT Re-evaluation, 97110-Therapeutic exercises, 97530- Therapeutic activity, 97112- Neuromuscular re-education, 97535- Self Care, 02859- Manual therapy, [phone]- Aquatic Therapy, H9716- Electrical stimulation (unattended), 97016- Vasopneumatic device, Patient/Family education, Balance training, Joint mobilization, Cryotherapy, and Moist heat  PLAN FOR NEXT SESSION: Start with Rec bike and increase quad strength as appropriate while keeping HEP simple and manageable.   Myer LELON Ivory, PT, MPT 09/30/2024, 2:08 PM

## 2024-10-27 LAB — COLOGUARD: COLOGUARD: NEGATIVE

## 2024-11-26 ENCOUNTER — Other Ambulatory Visit (HOSPITAL_COMMUNITY)
Admission: RE | Admit: 2024-11-26 | Discharge: 2024-11-26 | Disposition: A | Discharge status: Home / Self Care | Source: Ambulatory Visit | Attending: Family Medicine | Admitting: Family Medicine

## 2024-11-26 ENCOUNTER — Encounter: Payer: Self-pay | Admitting: Family Medicine

## 2024-11-26 ENCOUNTER — Ambulatory Visit: Payer: Self-pay | Admitting: Family Medicine

## 2024-11-26 VITALS — BP 133/88 | HR 79 | Ht 62.0 in | Wt 244.2 lb

## 2024-11-26 DIAGNOSIS — Z113 Encounter for screening for infections with a predominantly sexual mode of transmission: Secondary | ICD-10-CM | POA: Diagnosis present

## 2024-11-26 DIAGNOSIS — Z0000 Encounter for general adult medical examination without abnormal findings: Secondary | ICD-10-CM | POA: Diagnosis present

## 2024-11-26 DIAGNOSIS — Z124 Encounter for screening for malignant neoplasm of cervix: Secondary | ICD-10-CM

## 2024-11-26 DIAGNOSIS — Z01419 Encounter for gynecological examination (general) (routine) without abnormal findings: Secondary | ICD-10-CM | POA: Diagnosis not present

## 2024-11-26 LAB — CERVICOVAGINAL ANCILLARY ONLY
Bacterial Vaginitis (gardnerella): NEGATIVE
Candida Glabrata: NEGATIVE
Candida Vaginitis: NEGATIVE
Chlamydia: NEGATIVE
Comment: NEGATIVE
Comment: NEGATIVE
Comment: NEGATIVE
Comment: NEGATIVE
Comment: NEGATIVE
Comment: NORMAL
Neisseria Gonorrhea: NEGATIVE
Trichomonas: NEGATIVE

## 2024-11-26 NOTE — Patient Instructions (Signed)
 Things to do to keep yourself healthy! - Exercise at least 30-45 minutes a day, 3-4 days a week.  - Eat a low-fat diet with lots of fruits and vegetables, up to 7-9 servings per day.  - Seatbelts can save your life. Wear them always.  - Smoke detectors on every level of your home, check batteries every year.  - Eye Doctor: have an eye exam every 1-2 years, even if you do not wear glasses or contacts. - Safe sex: if you may be exposed to STDs, use a condom.  - Alcohol: If you drink, do it moderately, less than 2 drinks per day.  - Health Care Power of Attorney: choose someone to speak for you if you are not able.  - Depression and anxiety are common in our stressful world. If you're feeling stressed, down, or like you're losing interest in things you normally enjoy, please come in for a visit.  - Violence: If anyone is threatening or hurting you, please call immediately.  Everyone deserves to be safe and loved in all of their relationships.

## 2024-11-26 NOTE — Progress Notes (Signed)
 "  ANNUAL EXAM Patient name: Danielle Santiago MRN [id number]  Date of birth: 1971/07/15 Chief Complaint:   No chief complaint on file.  History of Present Illness:   Danielle Santiago is a 54 y.o. G3P0 Caucasian female being seen today for a routine annual exam.  Current complaints: PAP in 2018, mammogram 05/2024, Cologuard with PCP 10/2024. She reports she still has regular periods but missed one cycle this past September. Denies VMS of menopause, difficulty sleeping.    Patient's last menstrual period was 10/25/2024 (exact date).   The pregnancy intention screening data noted above was reviewed. Potential methods of contraception were discussed. The patient elected to proceed with No data recorded.   Last pap 2018. Results were: NILM w/ HRHPV negative. H/O abnormal pap: no Last mammogram: t/2025. Results were: normal. Family h/o breast cancer: no Last colonoscopy: never, sent Cologuard 10/2024. Family h/o colorectal cancer: no     11/26/2024    9:54 AM  Depression screen PHQ 2/9  Decreased Interest 0  Down, Depressed, Hopeless 0  PHQ - 2 Score 0  Altered sleeping 0  Tired, decreased energy 0  Change in appetite 0  Feeling bad or failure about yourself  0  Trouble concentrating 0  Moving slowly or fidgety/restless 0  Suicidal thoughts 0  PHQ-9 Score 0        11/26/2024    9:54 AM  GAD 7 : Generalized Anxiety Score  Nervous, Anxious, on Edge 0  Control/stop worrying 0  Worry too much - different things 0  Trouble relaxing 0  Restless 0  Easily annoyed or irritable 0  Afraid - awful might happen 0  Total GAD 7 Score 0     Review of Systems:   Pertinent items are noted in HPI Denies any headaches, blurred vision, fatigue, shortness of breath, chest pain, abdominal pain, abnormal vaginal discharge/itching/odor/irritation, problems with periods, bowel movements, urination, or intercourse unless otherwise stated above.  Pertinent History Reviewed:  Reviewed past  medical,surgical, social and family history.  Reviewed problem list, medications and allergies.  Physical Assessment:   Vitals:   11/26/24 0932  BP: 133/88  Pulse: 79  Weight: 244 lb 3.2 oz (110.8 kg)  Height: 5' 2 (1.575 m)   Body mass index is 44.66 kg/m.   Physical Examination:  General appearance - well appearing, and in no distress Mental status - alert, oriented to person, place, and time Psych:  She has a normal mood and affect Skin - warm and dry, normal color, no suspicious lesions noted Chest - effort normal, no problems with respiration noted Heart - normal rate and regular rhythm Neck:  midline trachea, no thyromegaly or nodules Breasts - breasts appear normal, no suspicious masses, no skin or nipple changes or  axillary nodes Abdomen - soft, nontender, nondistended, no masses or organomegaly Pelvic - VULVA: normal appearing vulva with no masses, tenderness or lesions   VAGINA: normal appearing vagina with normal color and discharge, no lesions   CERVIX: normal appearing cervix without discharge or lesions, no CMT Thin prep pap is done with HR HPV cotesting Extremities:  No swelling or varicosities noted  Chaperone present for exam  No results found for this or any previous visit (from the past 24 hours).  Assessment & Plan:      1. Annual physical exam (Primary) - Will follow up results of pap smear and manage accordingly. - Mammogram scheduled - Colon cancer screening is up to date with PCP, did Cologuard, negative. -  Routine preventative health maintenance measures emphasized. - Discussed definition of menopause as 12 consecutive months without menses. Discussed that if menopausal symptoms start, can schedule an appointment to discuss management options. - Please refer to After Visit Summary for other counseling recommendations.  - Cytology - PAP( [place]) - Cervicovaginal ancillary only( [place]) - Hepatitis B Surface AntiGEN - Hepatitis C  Antibody - HIV antibody (with reflex) - RPR  2. Screen for STD (sexually transmitted disease) - Cervicovaginal ancillary only( [place]) - Hepatitis B Surface AntiGEN - Hepatitis C Antibody - HIV antibody (with reflex) - RPR  Mammogram: in 1 year, or sooner if problems Colon cancer screening: Cologuard every 3 years  Orders Placed This Encounter  Procedures   Hepatitis B Surface AntiGEN   Hepatitis C Antibody   HIV antibody (with reflex)   RPR    Meds: No orders of the defined types were placed in this encounter.   Follow-up: Return in about 1 year (around 11/26/2025) for [place].  Joesph DELENA Sear, PA  "

## 2024-11-26 NOTE — Progress Notes (Signed)
 PAP in 2018 Mammogram 05/2024 Colonoscopy guide mail in 10/2024  Pt reports allergy to peanuts. Reports bumps appear on her face that go away after a few days.   Prediabetes controlled with diet.

## 2024-11-27 ENCOUNTER — Ambulatory Visit: Payer: Self-pay | Admitting: Family Medicine

## 2024-12-02 LAB — CYTOLOGY - PAP
Adequacy: ABNORMAL
Comment: NEGATIVE
High risk HPV: NEGATIVE

## 2025-01-01 ENCOUNTER — Ambulatory Visit: Payer: Self-pay | Admitting: Obstetrics and Gynecology
